# Patient Record
Sex: Female | Born: 1935 | Race: White | Hispanic: No | Marital: Married | Smoking: Former smoker
Health system: Southern US, Community
[De-identification: ages and names within clinical notes are randomized; demographics above are authoritative.]

## PROBLEM LIST (undated history)

## (undated) DIAGNOSIS — Z87898 Personal history of other specified conditions: Secondary | ICD-10-CM

## (undated) DIAGNOSIS — M75100 Unspecified rotator cuff tear or rupture of unspecified shoulder, not specified as traumatic: Secondary | ICD-10-CM

## (undated) DIAGNOSIS — Z85828 Personal history of other malignant neoplasm of skin: Secondary | ICD-10-CM

## (undated) DIAGNOSIS — S46219A Strain of muscle, fascia and tendon of other parts of biceps, unspecified arm, initial encounter: Secondary | ICD-10-CM

## (undated) DIAGNOSIS — I2510 Atherosclerotic heart disease of native coronary artery without angina pectoris: Secondary | ICD-10-CM

## (undated) DIAGNOSIS — M1990 Unspecified osteoarthritis, unspecified site: Secondary | ICD-10-CM

## (undated) DIAGNOSIS — M797 Fibromyalgia: Secondary | ICD-10-CM

## (undated) HISTORY — PX: BREAST SURGERY: SHX581

## (undated) HISTORY — PX: CHOLECYSTECTOMY: SHX55

## (undated) HISTORY — PX: CARDIAC CATHETERIZATION: SHX172

---

## 2006-03-24 HISTORY — PX: OTHER SURGICAL HISTORY: SHX169

## 2010-03-24 HISTORY — PX: JOINT REPLACEMENT: SHX530

## 2010-04-10 LAB — URINALYSIS, ROUTINE W REFLEX MICROSCOPIC
Bilirubin Urine: NEGATIVE
Hgb urine dipstick: NEGATIVE
Ketones, ur: NEGATIVE mg/dL
Nitrite: NEGATIVE
Protein, ur: NEGATIVE mg/dL
Specific Gravity, Urine: 1.02 (ref 1.005–1.030)
Urine Glucose, Fasting: NEGATIVE mg/dL
Urobilinogen, UA: 1 mg/dL (ref 0.0–1.0)
pH: 7 (ref 5.0–8.0)

## 2010-04-10 LAB — CBC
HCT: 40.4 % (ref 36.0–46.0)
Hemoglobin: 12.6 g/dL (ref 12.0–15.0)
MCH: 29.6 pg (ref 26.0–34.0)
MCHC: 31.2 g/dL (ref 30.0–36.0)
MCV: 95.1 fL (ref 78.0–100.0)
Platelets: 214 10*3/uL (ref 150–400)
RBC: 4.25 MIL/uL (ref 3.87–5.11)
RDW: 15.8 % — ABNORMAL HIGH (ref 11.5–15.5)
WBC: 7.3 10*3/uL (ref 4.0–10.5)

## 2010-04-10 LAB — DIFFERENTIAL
Basophils Absolute: 0.1 10*3/uL (ref 0.0–0.1)
Basophils Relative: 1 % (ref 0–1)
Eosinophils Absolute: 0.3 10*3/uL (ref 0.0–0.7)
Eosinophils Relative: 4 % (ref 0–5)
Lymphocytes Relative: 21 % (ref 12–46)
Lymphs Abs: 1.5 10*3/uL (ref 0.7–4.0)
Monocytes Absolute: 0.4 10*3/uL (ref 0.1–1.0)
Monocytes Relative: 6 % (ref 3–12)
Neutro Abs: 5 10*3/uL (ref 1.7–7.7)
Neutrophils Relative %: 68 % (ref 43–77)

## 2010-04-10 LAB — SURGICAL PCR SCREEN: Staphylococcus aureus: INVALID — AB

## 2010-04-10 LAB — ABO/RH: ABO/RH(D): O NEG

## 2010-04-10 LAB — TYPE AND SCREEN
ABO/RH(D): O NEG
Antibody Screen: NEGATIVE

## 2010-04-10 LAB — BASIC METABOLIC PANEL
BUN: 21 mg/dL (ref 6–23)
CO2: 30 mEq/L (ref 19–32)
Calcium: 9.7 mg/dL (ref 8.4–10.5)
Chloride: 105 mEq/L (ref 96–112)
Creatinine, Ser: 1.54 mg/dL — ABNORMAL HIGH (ref 0.4–1.2)
GFR calc Af Amer: 40 mL/min — ABNORMAL LOW (ref >60)
GFR calc non Af Amer: 33 mL/min — ABNORMAL LOW (ref >60)
Glucose, Bld: 105 mg/dL — ABNORMAL HIGH (ref 70–99)
Potassium: 4.9 mEq/L (ref 3.5–5.1)
Sodium: 142 mEq/L (ref 135–145)

## 2010-04-10 LAB — PROTIME-INR
INR: 0.97 (ref 0.00–1.49)
Prothrombin Time: 13.1 seconds (ref 11.6–15.2)

## 2010-04-12 ENCOUNTER — Inpatient Hospital Stay (HOSPITAL_COMMUNITY)
Admission: RE | Admit: 2010-04-12 | Discharge: 2010-04-16 | Payer: Self-pay | Source: Home / Self Care | Attending: Orthopaedic Surgery | Admitting: Orthopaedic Surgery

## 2010-04-15 LAB — SURGICAL PCR SCREEN
MRSA, PCR: NEGATIVE
Staphylococcus aureus: NEGATIVE

## 2010-04-16 LAB — CBC
HCT: 32.3 % — ABNORMAL LOW (ref 36.0–46.0)
HCT: 31.6 % — ABNORMAL LOW (ref 36.0–46.0)
HCT: 30.9 % — ABNORMAL LOW (ref 36.0–46.0)
Hemoglobin: 10.2 g/dL — ABNORMAL LOW (ref 12.0–15.0)
Hemoglobin: 9.8 g/dL — ABNORMAL LOW (ref 12.0–15.0)
Hemoglobin: 9.8 g/dL — ABNORMAL LOW (ref 12.0–15.0)
MCH: 29.7 pg (ref 26.0–34.0)
MCH: 29 pg (ref 26.0–34.0)
MCH: 29.6 pg (ref 26.0–34.0)
MCHC: 31.6 g/dL (ref 30.0–36.0)
MCHC: 31 g/dL (ref 30.0–36.0)
MCHC: 31.7 g/dL (ref 30.0–36.0)
MCV: 93.9 fL (ref 78.0–100.0)
MCV: 93.5 fL (ref 78.0–100.0)
MCV: 93.4 fL (ref 78.0–100.0)
Platelets: 169 10*3/uL (ref 150–400)
Platelets: 157 10*3/uL (ref 150–400)
Platelets: 173 10*3/uL (ref 150–400)
RBC: 3.44 MIL/uL — ABNORMAL LOW (ref 3.87–5.11)
RBC: 3.38 MIL/uL — ABNORMAL LOW (ref 3.87–5.11)
RBC: 3.31 MIL/uL — ABNORMAL LOW (ref 3.87–5.11)
RDW: 15.7 % — ABNORMAL HIGH (ref 11.5–15.5)
RDW: 15.7 % — ABNORMAL HIGH (ref 11.5–15.5)
RDW: 15.2 % (ref 11.5–15.5)
WBC: 9.4 10*3/uL (ref 4.0–10.5)
WBC: 8.6 10*3/uL (ref 4.0–10.5)
WBC: 10.5 10*3/uL (ref 4.0–10.5)

## 2010-04-16 LAB — BASIC METABOLIC PANEL
BUN: 13 mg/dL (ref 6–23)
BUN: 9 mg/dL (ref 6–23)
BUN: 11 mg/dL (ref 6–23)
CO2: 32 mEq/L (ref 19–32)
CO2: 32 mEq/L (ref 19–32)
CO2: 35 mEq/L — ABNORMAL HIGH (ref 19–32)
Calcium: 8.6 mg/dL (ref 8.4–10.5)
Calcium: 8.6 mg/dL (ref 8.4–10.5)
Calcium: 8.8 mg/dL (ref 8.4–10.5)
Chloride: 103 mEq/L (ref 96–112)
Chloride: 101 mEq/L (ref 96–112)
Chloride: 98 mEq/L (ref 96–112)
Creatinine, Ser: 1.11 mg/dL (ref 0.4–1.2)
Creatinine, Ser: 1.1 mg/dL (ref 0.4–1.2)
Creatinine, Ser: 1.14 mg/dL (ref 0.4–1.2)
GFR calc Af Amer: 58 mL/min — ABNORMAL LOW (ref >60)
GFR calc Af Amer: 59 mL/min — ABNORMAL LOW (ref >60)
GFR calc Af Amer: 57 mL/min — ABNORMAL LOW (ref >60)
GFR calc non Af Amer: 48 mL/min — ABNORMAL LOW (ref >60)
GFR calc non Af Amer: 49 mL/min — ABNORMAL LOW (ref >60)
GFR calc non Af Amer: 47 mL/min — ABNORMAL LOW (ref >60)
Glucose, Bld: 122 mg/dL — ABNORMAL HIGH (ref 70–99)
Glucose, Bld: 114 mg/dL — ABNORMAL HIGH (ref 70–99)
Glucose, Bld: 115 mg/dL — ABNORMAL HIGH (ref 70–99)
Potassium: 4.7 mEq/L (ref 3.5–5.1)
Potassium: 3.7 mEq/L (ref 3.5–5.1)
Potassium: 3.8 mEq/L (ref 3.5–5.1)
Sodium: 141 mEq/L (ref 135–145)
Sodium: 141 mEq/L (ref 135–145)
Sodium: 140 mEq/L (ref 135–145)

## 2010-04-16 LAB — PROTIME-INR
INR: 1.18 (ref 0.00–1.49)
INR: 1.86 — ABNORMAL HIGH (ref 0.00–1.49)
INR: 2.21 — ABNORMAL HIGH (ref 0.00–1.49)
Prothrombin Time: 15.2 seconds (ref 11.6–15.2)
Prothrombin Time: 21.6 seconds — ABNORMAL HIGH (ref 11.6–15.2)
Prothrombin Time: 24.7 seconds — ABNORMAL HIGH (ref 11.6–15.2)

## 2010-04-16 LAB — MRSA CULTURE

## 2010-04-17 LAB — PROTIME-INR
INR: 2.33 — ABNORMAL HIGH (ref 0.00–1.49)
Prothrombin Time: 25.7 seconds — ABNORMAL HIGH (ref 11.6–15.2)

## 2010-04-23 NOTE — Op Note (Signed)
Megan Wong, EIFLER                   ACCOUNT NO.:  [id number]  MEDICAL RECORD NO.:  [id number]          PATIENT TYPE:  INP  LOCATION:  0003                         FACILITY:  [facility]  PHYSICIAN:  Vanita Panda. Magnus Ivan, M.D.DATE OF BIRTH:  1936/02/10  DATE OF PROCEDURE:  04/12/2010 DATE OF DISCHARGE:                              OPERATIVE REPORT   PREOPERATIVE DIAGNOSES:  Severe osteoarthritis and degenerative joint disease, right hip.  POSTOPERATIVE DIAGNOSES:  Severe osteoarthritis and degenerative joint disease, right hip.  PROCEDURE:  Right total hip arthroplasty using anterior approach.  IMPLANTS:  DePuy Pinnacle sector acetabular component size 50, neutral polyethylene +4 liner, Corail hydroxyapatite-coated stem with standard offset size 11, size 32 +1.5 ceramic femoral head.  SURGEON:  Vanita Panda. Magnus Ivan, M.D.  ASSISTANT:  Wende Neighbors, P.A-C.  ANESTHESIA:  General.  ANTIBIOTICS:  2 g IV Ancef.  BLOOD LOSS:  500 cc.  COMPLICATIONS:  None.  INDICATIONS:  Ms. Sulton is a very pleasant 75 year old female with severe debilitating arthritis involving her right hip.  This has affected her activities of daily living greatly and she wished to proceed with a total hip arthroplasty at this standpoint.  The risks and benefits of surgery have been explained to her in detail including the risk of acute blood loss anemia, DVT, and PE.  DESCRIPTION OF PROCEDURE:  After informed consent was obtained, appropriate right hip was marked.  Ms. Herro was brought to the operating room, general anesthesia was obtained while she was on the stretcher. Foley catheter was placed and then traction boots for the Hana table were applied.  She was placed on the Hana table and perineal post placed.  Both legs were in __________ with no traction applied.  Under direct fluoroscopic guidance, I was able to assess the hip center and the centering of the pelvis.  We then prepped the left hip  with DuraPrep and then sterile drapes.  A time-out was called and she was identified as the correct patient and correct right hip.  I then used an incision centimeter distal and 3 cm posterior to the anterior superior iliac spine and carried the incision down the leg in oblique fashion.  I dissected down to the tensor fascia lata and divided the tensor fascia lata in an oblique format.  I then next proceeded with the direct anterior approach.  Retractors were placed around the lateral neck and I teased the rectus femoris off bone and placed an anterior retractor as well.  I then cauterized the lateral femoral cutaneous vessels with electrocautery and I divided the hip capsule from lateral to medial to expose the femoral head and neck.  Under direct fluoroscopic guidance, I could see where I would like to make a neck cut as well as under direct visualization I made a neck cut with an oscillating saw and then removed the head in its entirety using corkscrew guide.  I then cleaned the acetabulum off debris.  Retractors placed over the anterior lip of the acetabulum as well as posterior.  I cleaned the acetabulum off debris around the rim as well as deep in  the fovea.  I then reamed starting from a size 42 up to a 49 reamer, the last 2 reamings were performed under direct fluoroscopic guidance.  I then chose the size 50 acetabular component with screw holes.  I tapped this into place and placed a hole eliminator guide, but I did not need to place any screw holes as there was a nice secure fit.  Next, attention was turned to the femur.  A hook was placed underneath the femur within the skin muscle and the leg was externally rotated to 90 degrees, extended and adducted.  Using retractors at the medial calcar and behind the greater trochanter, I then released tissue and released the bone hook to again exposure to the femoral canal using a cookie cutter guide and rongeur.  I was able to gain  access to the femoral canal, I then broaching from the size 8 broach up to a size 11 that was not the correct version.  I trialed 32 +1.5 head and reduced this into the acetabulum.  There was a nice tight fit and I felt that I had just a little bit long in the leg length.  I wanted to trial the higher offset neck, but I felt like we already had enough offset and the hip was very stable with internal and external rotation, flexion and extension.  I then removed the trial instrumentation and placed the real Corail standard offset stem size 11 and the real 36 +1.5 ceramic head and reduced this into the acetabulum as well.  Under direct fluoroscopy, she seemed like she was just a few millimeters long on the right side, but the hip was very stable and I felt given her disease on her left hip that this was appropriately labelled as a stable hip.  I then took the hook out of the leg as well. We copiously irrigated the tissues and I closed the joint capsules with #1 Ethibond suture followed by a running interrupted #1 Vicryl in the tensor fascia lata, 2-0 Vicryl in subcutaneous tissue and running 4-0 Monocryl subcuticular stitch.  Dermabond was placed distally, but proximally placed Xeroform and two 3-0 nylon sutures because with her obesity and pannus.  I am worried about the groin crease.  We will keep this area dry.  She was awakened, taken off the Hana table, extubated, and taken to recovery room in stable condition.  All final counts were correct and there were no complications noted.  Of note, Maud Deed, PA-C was present for the entirety of the case and her assistance was crucial from beginning to end with retracting and assisting with placement of components in accurate position.     Vanita Panda. Magnus Ivan, M.D.     CYB/MEDQ  D:  04/12/2010  T:  04/13/2010  Job:  [id number]  Electronically Signed by Doneen Poisson M.D. on 04/23/2010 07:37:55 AM

## 2010-04-23 NOTE — H&P (Signed)
  NAMEBELISSA, KOOY                   ACCOUNT NO.:  [id number]  MEDICAL RECORD NO.:  [id number]          PATIENT TYPE:  INP  LOCATION:  1608                         FACILITY:  [facility]  PHYSICIAN:  Vanita Panda. Magnus Ivan, M.D.DATE OF BIRTH:  [birth year]  DATE OF ADMISSION:  04/12/2010 DATE OF DISCHARGE:                             HISTORY & PHYSICAL   CHIEF COMPLAINT:  Right hip pain with known severe osteoarthritis.  HISTORY OF PRESENT ILLNESS:  [name] is a 75 year old female with severe pain involving the right hip.  X-rays confirmed osteoarthritis of the hip.  This has been a debilitating condition for her in terms of its effects on her activities of daily living.  She would like to proceed with a hip replacement at this time.  She understands the risks and benefits of the surgery in detail including the risks of DVT, PE, and acute blood loss anemia.  PAST MEDICAL HISTORY:  She has had a stent in the heart placed in 2005. She has had her gallbladder out in 2010.  She also has a history of heart disease and rheumatoid arthritis.  MEDICATIONS: 1. Celebrex. 2. Methotrexate. 3. Gabapentin. 4. Folic acid.  ALLERGIES:  CODEINE.  SOCIAL HISTORY:  She is married, her husband is with her.  She is retired.  She does not smoke and does not drink.  FAMILY MEDICAL HISTORY:  Diabetes.  REVIEW OF SYSTEMS:  Negative for chest pain, shortness of breath, fever, chills, nausea, or vomiting.  PHYSICAL EXAMINATION:  VITAL SIGNS:  She is afebrile with stable vital signs. GENERAL:  She is a pleasant 75 year old female in no acute distress. HEENT:  Normocephalic, atraumatic.  Pupils are equal, round, and reactive to light.  Extraocular muscles intact. NECK:  Supple. LUNGS:  Clear to auscultation bilaterally. HEART:  Regular rate and rhythm. ABDOMEN:  Obese, but soft and nontender with normoactive bowel sounds. EXTREMITIES:  Right hip shows severe pain with internal and  external rotation.  X-rays confirm osteoarthritis of the right hip.  ASSESSMENT/PLAN:  This is a 75 year old female with severe osteoarthritis of her right hip.  PLAN:  We will proceed to the operating room today for a right total hip arthroplasty.     Vanita Panda. Magnus Ivan, M.D.     CYB/MEDQ  D:  04/12/2010  T:  04/12/2010  Job:  [id number]  Electronically Signed by Doneen Poisson M.D. on 04/23/2010 07:37:50 AM

## 2010-04-23 NOTE — Discharge Summary (Signed)
  [name], [name]                   ACCOUNT NO.:  [id number]  MEDICAL RECORD NO.:  [id number]          PATIENT TYPE:  INP  LOCATION:  1608                         FACILITY:  [facility]  PHYSICIAN:  Vanita Panda. Magnus Ivan, M.D.DATE OF BIRTH:  Apr 11, 1935  DATE OF ADMISSION:  04/12/2010 DATE OF DISCHARGE:  04/16/2010                              DISCHARGE SUMMARY   ADMITTING DIAGNOSES: 1. Severe osteoarthritis. 2. Degenerative joint disease, right hip.  DISCHARGE DIAGNOSES: 1. Severe osteoarthritis. 2. Degenerative joint disease, right hip.  PROCEDURE:  Right total hip arthroplasty through a direct anterior approach on April 12, 2010.  HOSPITAL COURSE:  [name] is a 75 year old female with debilitating arthritis involving her right hip, which has affected her activities of daily living, and she wished to proceed with a total hip arthroplasty, and the risks and benefits of this were explained to her in detail.  She was taken to the operating room on the day of admission where she underwent a right total hip arthroplasty through a direct anterior approach without complications.  She was then admitted to the inpatient orthopedic floor, and her hospital course was uneventful.  She had acute blood loss anemia, but the hemoglobin only went down to 9.8. She was asymptomatic from this and did not require any type of transfusion.  By the day of discharge, her incision was clean, dry and intact. She was mobilizing well with therapy, and it was felt she could be discharged safely to home with Home Health therapy and family assistance.  DISPOSITION:  To home.  DISCHARGE INSTRUCTIONS:  While she is at home, they will continue to work with her with gait training, balance and coordination using a walker as assistance.  She will not have any hip precautions otherwise. Her incision can get wet in the shower starting this Thursday, and a dry dressing applied daily.  She will continue all her  same medications as listed on her home reconciliation orders except for aspirin, and her additional medications will include Percocet, Robaxin and Coumadin. Home Health will come in to assess her INR for Coumadin dosing as well. A followup appointment will be established in two weeks in my office.     Vanita Panda. Magnus Ivan, M.D.     CYB/MEDQ  D:  04/16/2010  T:  04/16/2010  Job:  [id number]  Electronically Signed by Doneen Poisson M.D. on 04/23/2010 07:37:45 AM

## 2010-12-24 ENCOUNTER — Other Ambulatory Visit: Payer: Self-pay | Admitting: Orthopaedic Surgery

## 2010-12-24 DIAGNOSIS — M545 Low back pain, unspecified: Secondary | ICD-10-CM

## 2010-12-31 ENCOUNTER — Ambulatory Visit
Admission: RE | Admit: 2010-12-31 | Discharge: 2010-12-31 | Disposition: A | Discharge status: Home / Self Care | Payer: Medicare Other | Source: Ambulatory Visit | Attending: Orthopaedic Surgery | Admitting: Orthopaedic Surgery

## 2010-12-31 DIAGNOSIS — M545 Low back pain, unspecified: Secondary | ICD-10-CM

## 2011-11-03 ENCOUNTER — Other Ambulatory Visit: Payer: Self-pay | Admitting: Orthopaedic Surgery

## 2011-11-03 DIAGNOSIS — M25551 Pain in right hip: Secondary | ICD-10-CM

## 2011-11-05 ENCOUNTER — Ambulatory Visit
Admission: RE | Admit: 2011-11-05 | Discharge: 2011-11-05 | Disposition: A | Discharge status: Home / Self Care | Payer: Medicare Other | Source: Ambulatory Visit | Attending: Orthopaedic Surgery | Admitting: Orthopaedic Surgery

## 2011-11-05 DIAGNOSIS — M25551 Pain in right hip: Secondary | ICD-10-CM

## 2011-12-17 IMAGING — CR DG CHEST 2V
2 series · 2 of 2 positions shown · non-contrast
Comparison: None.

CLINICAL DATA: Right hip osteoarthritis, preop

CHEST - 2 VIEW

[w chest pa]
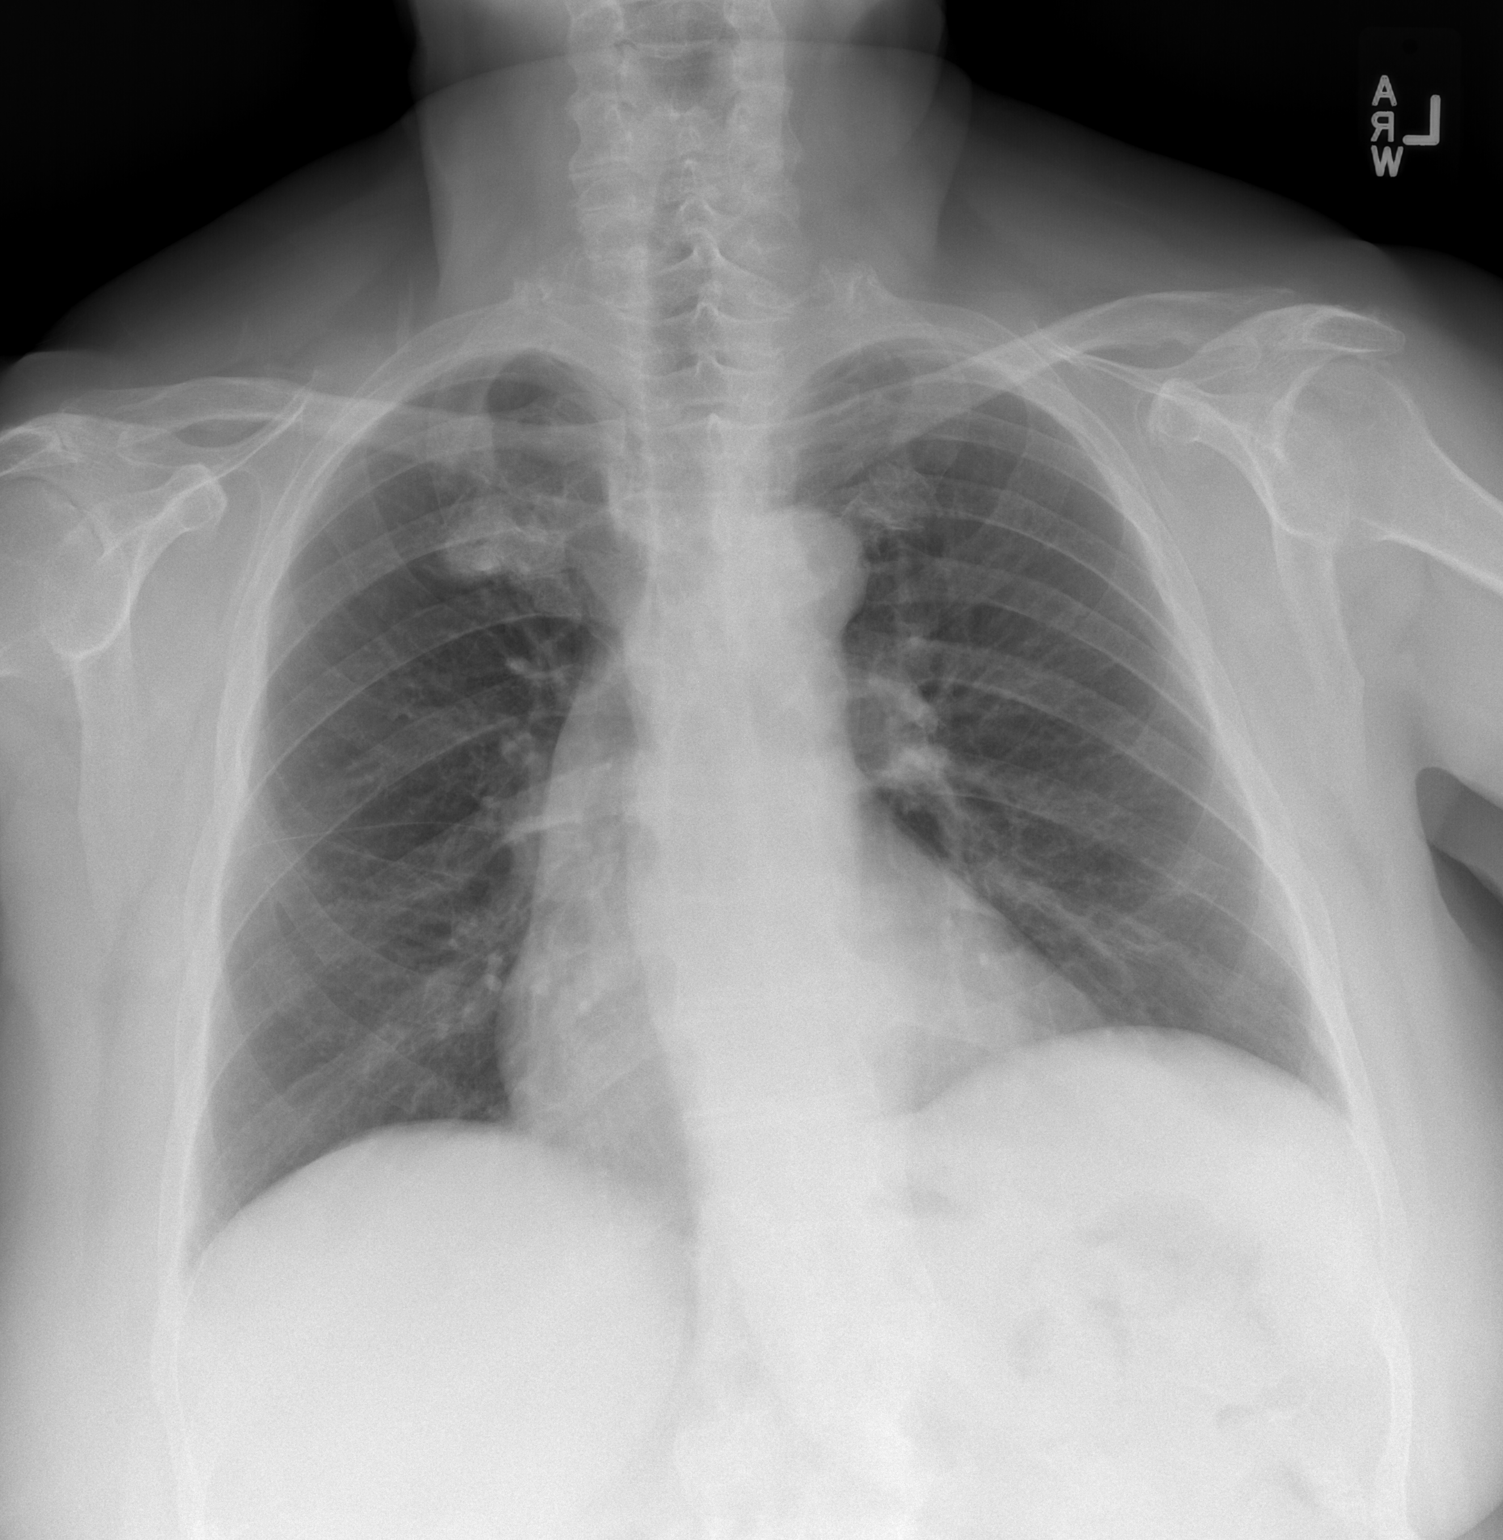

[w chest lat]
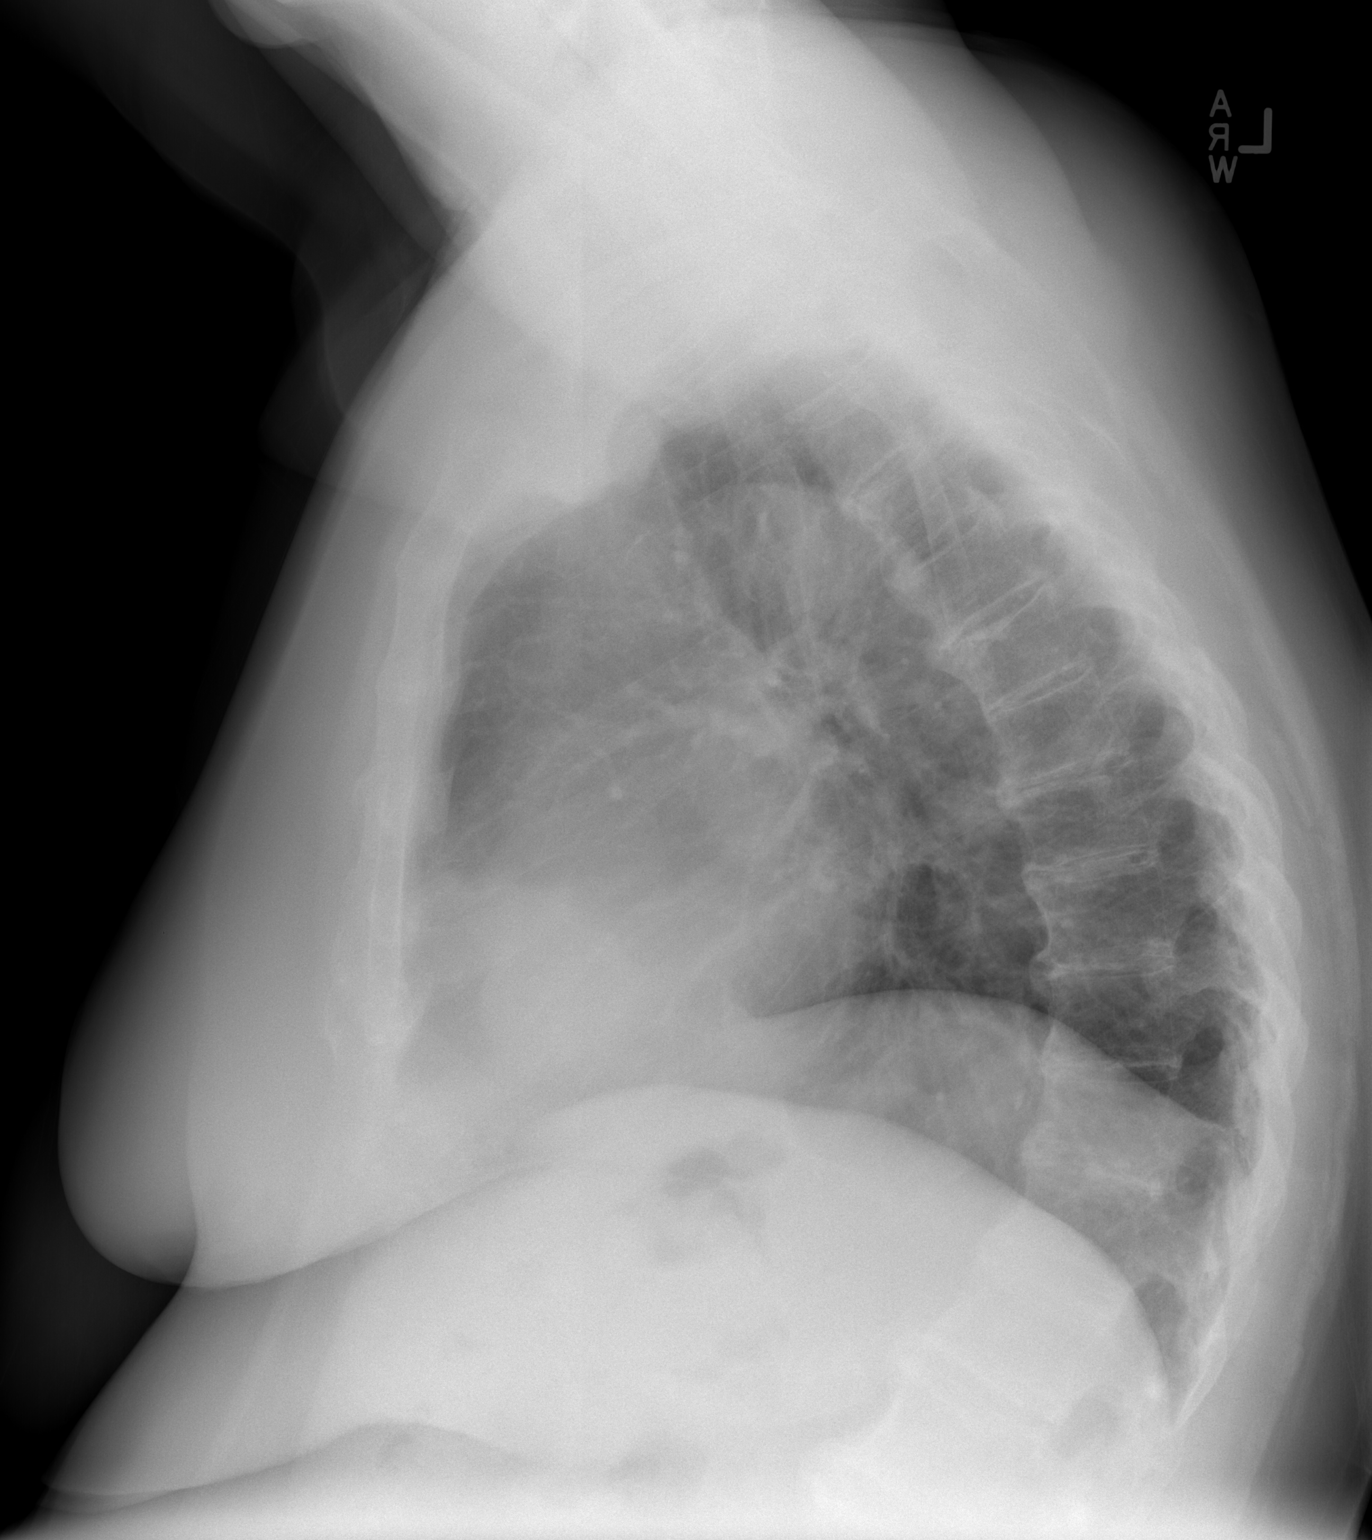

[2 of 2 positions shown; findings below may reference images not displayed]

FINDINGS: Asymmetric prominence of the right first rib
costochondral calcifications versus right upper lobe nodule.  Left
lung clear.  Heart size normal.  No effusion.  Mild spondylitic
changes at multiple contiguous levels through the mid and lower
thoracic spine.
IMPRESSION: 1.  Right upper lung nodular density.  Favor asymmetric
costochondral calcification over mass.  In the absence of any prior
studies to confirm stability, this could be confirmed with a
lordotic view or noncontrast CT.

## 2011-12-21 IMAGING — CR DG HIP 1V PORT*R*
1 series · 1 of 1 positions shown · non-contrast
Comparison: Portable AP pelvis x-ray obtained concurrently.

CLINICAL DATA: Postop right hip arthroplasty.

PORTABLE RIGHT HIP - 1 VIEW [DATE]/3503 0035 hours:

[shoot- thru lateral]
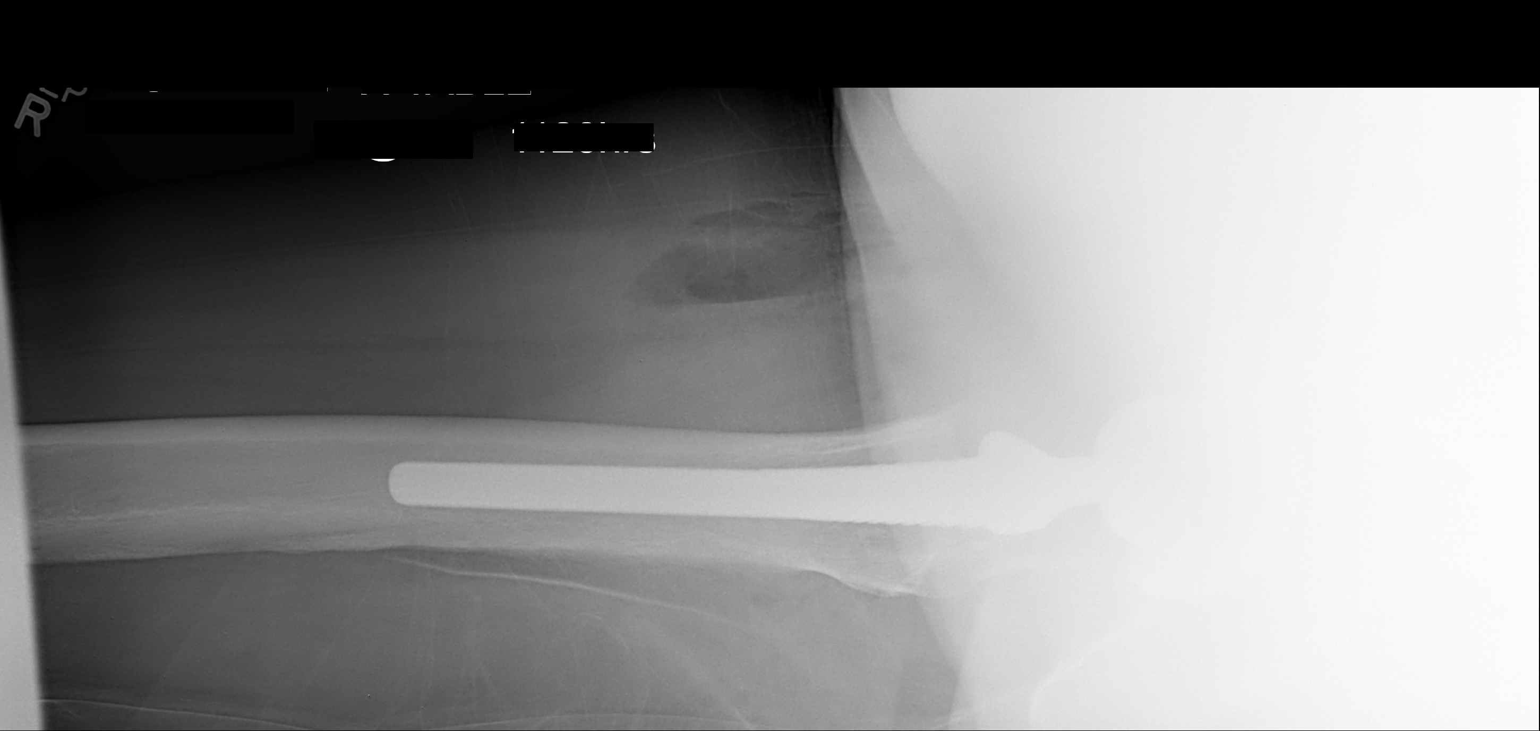

[1 of 1 positions shown; findings below may reference images not displayed]

FINDINGS: Anatomic alignment post right hip arthroplasty.  No
complicating features.  Gas within the adjacent soft tissues, as
expected.
IMPRESSION: Anatomic alignment post right hip arthroplasty without acute
complicating features.

## 2011-12-23 IMAGING — CR DG HIP 1V PORT*R*
1 series · 1 of 1 positions shown · non-contrast
Comparison: Radiographs dated 04/12/2010

CLINICAL DATA: Right hip pain.  Right total hip prosthesis
insertion 2 days ago.

PORTABLE RIGHT HIP - 1 VIEW

[AP]
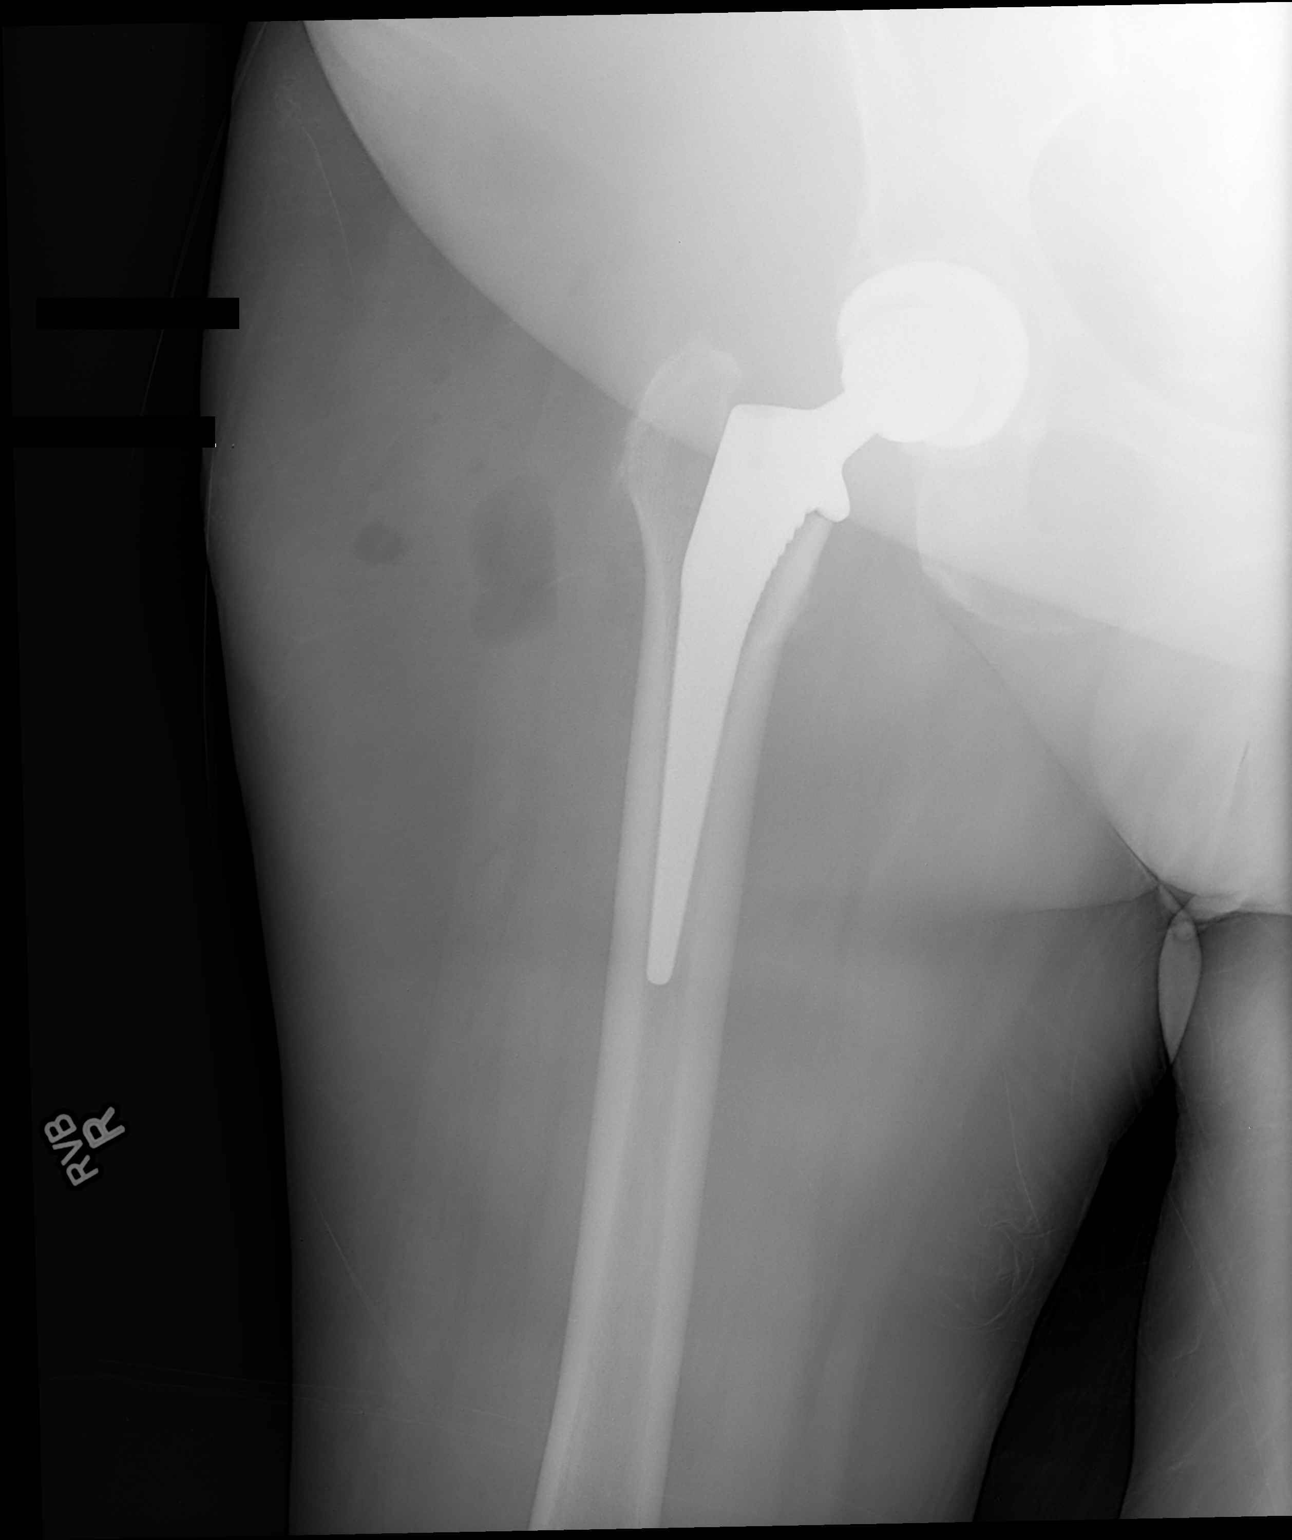

[1 of 1 positions shown; findings below may reference images not displayed]

FINDINGS: The acetabular and femoral components appear in excellent
position.  No fracture or dislocation.
IMPRESSION: No acute abnormalities.

## 2013-06-02 ENCOUNTER — Other Ambulatory Visit: Payer: Self-pay | Admitting: Orthopaedic Surgery

## 2013-06-02 DIAGNOSIS — M25511 Pain in right shoulder: Secondary | ICD-10-CM

## 2013-06-05 ENCOUNTER — Ambulatory Visit
Admission: RE | Admit: 2013-06-05 | Discharge: 2013-06-05 | Disposition: A | Discharge status: Home / Self Care | Payer: Medicare Other | Source: Ambulatory Visit | Attending: Orthopaedic Surgery | Admitting: Orthopaedic Surgery

## 2013-06-05 DIAGNOSIS — M25511 Pain in right shoulder: Secondary | ICD-10-CM

## 2013-06-10 ENCOUNTER — Other Ambulatory Visit (HOSPITAL_COMMUNITY): Payer: Self-pay | Admitting: Orthopaedic Surgery

## 2013-06-23 ENCOUNTER — Encounter (HOSPITAL_COMMUNITY): Payer: Self-pay | Admitting: Pharmacy Technician

## 2013-07-04 ENCOUNTER — Encounter (HOSPITAL_COMMUNITY): Payer: Self-pay

## 2013-07-04 ENCOUNTER — Encounter (HOSPITAL_COMMUNITY)
Admission: RE | Admit: 2013-07-04 | Discharge: 2013-07-04 | Disposition: A | Discharge status: Home / Self Care | Payer: Medicare Other | Source: Ambulatory Visit | Attending: Orthopaedic Surgery | Admitting: Orthopaedic Surgery

## 2013-07-04 DIAGNOSIS — I2510 Atherosclerotic heart disease of native coronary artery without angina pectoris: Secondary | ICD-10-CM | POA: Insufficient documentation

## 2013-07-04 DIAGNOSIS — S43429A Sprain of unspecified rotator cuff capsule, initial encounter: Secondary | ICD-10-CM | POA: Insufficient documentation

## 2013-07-04 DIAGNOSIS — X58XXXA Exposure to other specified factors, initial encounter: Secondary | ICD-10-CM | POA: Insufficient documentation

## 2013-07-04 DIAGNOSIS — Z01810 Encounter for preprocedural cardiovascular examination: Secondary | ICD-10-CM | POA: Insufficient documentation

## 2013-07-04 DIAGNOSIS — Z01812 Encounter for preprocedural laboratory examination: Secondary | ICD-10-CM | POA: Insufficient documentation

## 2013-07-04 DIAGNOSIS — M19019 Primary osteoarthritis, unspecified shoulder: Secondary | ICD-10-CM | POA: Insufficient documentation

## 2013-07-04 HISTORY — DX: Personal history of other malignant neoplasm of skin: Z85.828

## 2013-07-04 HISTORY — DX: Unspecified rotator cuff tear or rupture of unspecified shoulder, not specified as traumatic: M75.100

## 2013-07-04 HISTORY — DX: Personal history of other specified conditions: Z87.898

## 2013-07-04 HISTORY — DX: Atherosclerotic heart disease of native coronary artery without angina pectoris: I25.10

## 2013-07-04 HISTORY — DX: Fibromyalgia: M79.7

## 2013-07-04 HISTORY — DX: Unspecified osteoarthritis, unspecified site: M19.90

## 2013-07-04 HISTORY — DX: Strain of muscle, fascia and tendon of other parts of biceps, unspecified arm, initial encounter: S46.219A

## 2013-07-04 LAB — CBC
HEMATOCRIT: 41.5 % (ref 36.0–46.0)
Hemoglobin: 13.1 g/dL (ref 12.0–15.0)
MCH: 28.9 pg (ref 26.0–34.0)
MCHC: 31.6 g/dL (ref 30.0–36.0)
MCV: 91.4 fL (ref 78.0–100.0)
Platelets: 199 10*3/uL (ref 150–400)
RBC: 4.54 MIL/uL (ref 3.87–5.11)
RDW: 15.9 % — AB (ref 11.5–15.5)
WBC: 6.2 10*3/uL (ref 4.0–10.5)

## 2013-07-04 LAB — BASIC METABOLIC PANEL
BUN: 22 mg/dL (ref 6–23)
CALCIUM: 9.4 mg/dL (ref 8.4–10.5)
CO2: 29 mEq/L (ref 19–32)
Chloride: 103 mEq/L (ref 96–112)
Creatinine, Ser: 1.27 mg/dL — ABNORMAL HIGH (ref 0.50–1.10)
GFR calc Af Amer: 47 mL/min — ABNORMAL LOW (ref >90)
GFR calc non Af Amer: 40 mL/min — ABNORMAL LOW (ref >90)
Glucose, Bld: 90 mg/dL (ref 70–99)
Potassium: 4.1 mEq/L (ref 3.7–5.3)
SODIUM: 142 meq/L (ref 137–147)

## 2013-07-04 NOTE — Progress Notes (Signed)
Contacted Dr. Maureen Ralphs Blackman concerning meds to stop preop I was instructed pt may continue all meds and my take methotrexate day of surgery also Pt informed

## 2013-07-04 NOTE — Patient Instructions (Addendum)
Megan Wong  07/04/2013                           YOUR PROCEDURE IS SCHEDULED ON: 07/08/13               PLEASE REPORT TO SHORT STAY CENTER AT : 12:00 PM               CALL THIS NUMBER IF ANY PROBLEMS THE DAY OF SURGERY :               [phone]                                REMEMBER:   Do not eat food  AFTER MIDNIGHT   May have clear liquids UNTIL 6 HOURS BEFORE SURGERY 8:30 AM               Take these medicines the morning of surgery with A SIP OF WATER:  METOPROLOL / METHOTREXATE   Do not wear jewelry, make-up   Do not wear lotions, powders, or perfumes.   Do not shave legs or underarms 12 hrs. before surgery (men may shave face)  Do not bring valuables to the hospital.  Contacts, dentures or bridgework may not be worn into surgery.  Leave suitcase in the car. After surgery it may be brought to your room.  For patients admitted to the hospital more than one night, checkout time is            11:00 AM                                                       The day of discharge.   Patients discharged the day of surgery will not be allowed to drive home.            If going home same day of surgery, must have someone stay with you              FIRST 24 hrs at home and arrange for some one to drive you              home from hospital.                                                            [facility] Before surgery, you can play an important role.  Because skin is not sterile, your skin needs to be as free of germs as possible.  You can reduce the number of germs on your skin by washing with CHG (chlorahexidine gluconate) soap before surgery.  CHG is an antiseptic cleaner which kills germs and bonds with the skin to continue killing germs even after washing. Please DO NOT use if you have an allergy to CHG or antibacterial soaps.  If your skin becomes reddened/irritated stop using the CHG and inform your nurse when you arrive at Short  Stay. Do not shave (including legs and underarms) for at least 48 hours prior to the first CHG  shower.  You may shave your face. Please follow these instructions carefully:  1.  Shower with CHG Soap the night before surgery and the  morning of Surgery.  2.  If you choose to wash your hair, wash your hair first as usual with your  normal  shampoo.  3.  After you shampoo, rinse your hair and body thoroughly to remove the  shampoo.                           4.  Use CHG as you would any other liquid soap.  You can apply chg directly  to the skin and wash                       Gently with a scrungie or clean washcloth.  5.  Apply the CHG Soap to your body ONLY FROM THE NECK DOWN.   Do not use on open                           Wound or open sores. Avoid contact with eyes, ears mouth and genitals (private parts).                        Genitals (private parts) with your normal soap.             6.  Wash thoroughly, paying special attention to the area where your surgery  will be performed.  7.  Thoroughly rinse your body with warm water from the neck down.  8.  DO NOT shower/wash with your normal soap after using and rinsing off  the CHG Soap.                9.  Pat yourself dry with a clean towel.            10.  Wear clean pajamas.            11.  Place clean sheets on your bed the night of your first shower and do not  sleep with pets. Day of Surgery : Do not apply any lotions/deodorants the morning of surgery.  Please wear clean clothes to the hospital/surgery center.  FAILURE TO FOLLOW THESE INSTRUCTIONS MAY RESULT IN THE CANCELLATION OF YOUR SURGERY PATIENT SIGNATURE_________________________________  NURSE SIGNATURE__________________________________    CLEAR LIQUID DIET   Foods Allowed                                                                     Foods Excluded  Coffee and tea, regular and decaf                             liquids that you cannot  Plain Jell-O in any flavor                                              see through such as: Fruit ices (not with fruit pulp)  milk, soups, orange juice  Iced Popsicles                                    All solid food Carbonated beverages, regular and diet                                    Cranberry, grape and apple juices Sports drinks like Gatorade Lightly seasoned clear broth or consume(fat free) Sugar, honey syrup  Sample Menu Breakfast                                Lunch                                     Supper Cranberry juice                    Beef broth                            Chicken broth Jell-O                                     Grape juice                           Apple juice Coffee or tea                        Jell-O                                      Popsicle                                                Coffee or tea                        Coffee or tea

## 2013-07-08 ENCOUNTER — Ambulatory Visit (HOSPITAL_COMMUNITY)
Admission: RE | Admit: 2013-07-08 | Discharge: 2013-07-08 | Disposition: A | Discharge status: Home / Self Care | Payer: Medicare Other | Source: Ambulatory Visit | Attending: Orthopaedic Surgery | Admitting: Orthopaedic Surgery

## 2013-07-08 ENCOUNTER — Encounter (HOSPITAL_COMMUNITY): Payer: Medicare Other | Admitting: Anesthesiology

## 2013-07-08 ENCOUNTER — Ambulatory Visit (HOSPITAL_COMMUNITY): Payer: Medicare Other | Admitting: Anesthesiology

## 2013-07-08 ENCOUNTER — Encounter (HOSPITAL_COMMUNITY)
Admission: RE | Disposition: A | Discharge status: Home / Self Care | Payer: Self-pay | Source: Ambulatory Visit | Attending: Orthopaedic Surgery

## 2013-07-08 ENCOUNTER — Encounter (HOSPITAL_COMMUNITY): Payer: Self-pay | Admitting: *Deleted

## 2013-07-08 DIAGNOSIS — Z85828 Personal history of other malignant neoplasm of skin: Secondary | ICD-10-CM | POA: Insufficient documentation

## 2013-07-08 DIAGNOSIS — IMO0001 Reserved for inherently not codable concepts without codable children: Secondary | ICD-10-CM | POA: Insufficient documentation

## 2013-07-08 DIAGNOSIS — R002 Palpitations: Secondary | ICD-10-CM | POA: Insufficient documentation

## 2013-07-08 DIAGNOSIS — M67919 Unspecified disorder of synovium and tendon, unspecified shoulder: Secondary | ICD-10-CM | POA: Insufficient documentation

## 2013-07-08 DIAGNOSIS — M7580 Other shoulder lesions, unspecified shoulder: Secondary | ICD-10-CM

## 2013-07-08 DIAGNOSIS — M19019 Primary osteoarthritis, unspecified shoulder: Secondary | ICD-10-CM | POA: Insufficient documentation

## 2013-07-08 DIAGNOSIS — M25819 Other specified joint disorders, unspecified shoulder: Secondary | ICD-10-CM | POA: Insufficient documentation

## 2013-07-08 DIAGNOSIS — I10 Essential (primary) hypertension: Secondary | ICD-10-CM | POA: Insufficient documentation

## 2013-07-08 DIAGNOSIS — M719 Bursopathy, unspecified: Principal | ICD-10-CM | POA: Insufficient documentation

## 2013-07-08 DIAGNOSIS — Z9089 Acquired absence of other organs: Secondary | ICD-10-CM | POA: Insufficient documentation

## 2013-07-08 DIAGNOSIS — I2510 Atherosclerotic heart disease of native coronary artery without angina pectoris: Secondary | ICD-10-CM | POA: Insufficient documentation

## 2013-07-08 DIAGNOSIS — Z96649 Presence of unspecified artificial hip joint: Secondary | ICD-10-CM | POA: Insufficient documentation

## 2013-07-08 DIAGNOSIS — M75110 Incomplete rotator cuff tear or rupture of unspecified shoulder, not specified as traumatic: Secondary | ICD-10-CM

## 2013-07-08 DIAGNOSIS — Z87891 Personal history of nicotine dependence: Secondary | ICD-10-CM | POA: Insufficient documentation

## 2013-07-08 HISTORY — PX: SHOULDER ARTHROSCOPY WITH ROTATOR CUFF REPAIR: SHX5685

## 2013-07-08 SURGERY — ARTHROSCOPY, SHOULDER, WITH ROTATOR CUFF REPAIR
Anesthesia: General | Site: Shoulder | Laterality: Right

## 2013-07-08 MED ORDER — PROMETHAZINE HCL 25 MG/ML IJ SOLN
6.2500 mg | INTRAMUSCULAR | Status: DC | PRN
Start: 2013-07-08 — End: 2013-07-08

## 2013-07-08 MED ORDER — ROPIVACAINE HCL 5 MG/ML IJ SOLN
INTRAMUSCULAR | Status: DC | PRN
  Administered 2013-07-08: 30 mL via PERINEURAL

## 2013-07-08 MED ORDER — HYDROMORPHONE HCL PF 1 MG/ML IJ SOLN
0.2500 mg | INTRAMUSCULAR | Status: DC | PRN
Start: 2013-07-08 — End: 2013-07-08

## 2013-07-08 MED ORDER — FENTANYL CITRATE 0.05 MG/ML IJ SOLN
INTRAMUSCULAR | Status: AC
  Filled 2013-07-08: qty 2

## 2013-07-08 MED ORDER — FENTANYL CITRATE 0.05 MG/ML IJ SOLN
INTRAMUSCULAR | Status: DC | PRN
  Administered 2013-07-08 (×2): 50 ug via INTRAVENOUS

## 2013-07-08 MED ORDER — SUCCINYLCHOLINE CHLORIDE 20 MG/ML IJ SOLN
INTRAMUSCULAR | Status: DC | PRN
  Administered 2013-07-08: 100 mg via INTRAVENOUS

## 2013-07-08 MED ORDER — LACTATED RINGERS IV SOLN
INTRAVENOUS | Status: DC
  Administered 2013-07-08: 1000 mL via INTRAVENOUS

## 2013-07-08 MED ORDER — TRAMADOL HCL 50 MG PO TABS: 50.0000 mg | ORAL_TABLET | Freq: Four times a day (QID) | ORAL | Status: AC | PRN

## 2013-07-08 MED ORDER — ONDANSETRON HCL 4 MG/2ML IJ SOLN
INTRAMUSCULAR | Status: AC
  Filled 2013-07-08: qty 2

## 2013-07-08 MED ORDER — ONDANSETRON HCL 4 MG PO TABS: 4.0000 mg | ORAL_TABLET | Freq: Three times a day (TID) | ORAL | Status: DC | PRN

## 2013-07-08 MED ORDER — MIDAZOLAM HCL 2 MG/2ML IJ SOLN
INTRAMUSCULAR | Status: AC
  Filled 2013-07-08: qty 2

## 2013-07-08 MED ORDER — GLYCOPYRROLATE 0.2 MG/ML IJ SOLN
INTRAMUSCULAR | Status: AC
  Filled 2013-07-08: qty 2

## 2013-07-08 MED ORDER — CEFAZOLIN SODIUM-DEXTROSE 2-3 GM-% IV SOLR
2.0000 g | INTRAVENOUS | Status: AC
  Administered 2013-07-08: 2 g via INTRAVENOUS

## 2013-07-08 MED ORDER — CYCLOBENZAPRINE HCL 10 MG PO TABS: 10.0000 mg | ORAL_TABLET | Freq: Three times a day (TID) | ORAL | Status: AC | PRN

## 2013-07-08 MED ORDER — FENTANYL CITRATE 0.05 MG/ML IJ SOLN
50.0000 ug | INTRAMUSCULAR | Status: DC | PRN
  Administered 2013-07-08: 25 ug via INTRAVENOUS

## 2013-07-08 MED ORDER — OXYCODONE-ACETAMINOPHEN 5-325 MG PO TABS: 1.0000 | ORAL_TABLET | ORAL | Status: DC | PRN

## 2013-07-08 MED ORDER — MIDAZOLAM HCL 2 MG/2ML IJ SOLN
1.0000 mg | INTRAMUSCULAR | Status: DC | PRN
  Administered 2013-07-08: 1 mg via INTRAVENOUS

## 2013-07-08 MED ORDER — EPINEPHRINE HCL 1 MG/ML IJ SOLN
INTRAMUSCULAR | Status: DC | PRN
  Administered 2013-07-08: 2 mL

## 2013-07-08 MED ORDER — EPHEDRINE SULFATE 50 MG/ML IJ SOLN
INTRAMUSCULAR | Status: AC
  Filled 2013-07-08: qty 1

## 2013-07-08 MED ORDER — SODIUM CHLORIDE 0.9 % IR SOLN
Status: DC | PRN
  Administered 2013-07-08: 6000 mL

## 2013-07-08 MED ORDER — GLYCOPYRROLATE 0.2 MG/ML IJ SOLN
INTRAMUSCULAR | Status: DC | PRN
  Administered 2013-07-08: 0.1 mg via INTRAVENOUS
  Administered 2013-07-08: 0.2 mg via INTRAVENOUS

## 2013-07-08 MED ORDER — EPINEPHRINE HCL 1 MG/ML IJ SOLN
INTRAMUSCULAR | Status: AC
  Filled 2013-07-08: qty 2

## 2013-07-08 MED ORDER — GLYCOPYRROLATE 0.2 MG/ML IJ SOLN
INTRAMUSCULAR | Status: AC
  Filled 2013-07-08: qty 1

## 2013-07-08 MED ORDER — ONDANSETRON HCL 4 MG/2ML IJ SOLN
INTRAMUSCULAR | Status: DC | PRN
  Administered 2013-07-08: 4 mg via INTRAVENOUS

## 2013-07-08 MED ORDER — ONDANSETRON 4 MG PO TBDP: 4.0000 mg | ORAL_TABLET | Freq: Three times a day (TID) | ORAL | Status: AC | PRN

## 2013-07-08 MED ORDER — EPHEDRINE SULFATE 50 MG/ML IJ SOLN
INTRAMUSCULAR | Status: DC | PRN
  Administered 2013-07-08: 10 mg via INTRAVENOUS

## 2013-07-08 MED ORDER — SODIUM CHLORIDE 0.9 % IJ SOLN
INTRAMUSCULAR | Status: AC
  Filled 2013-07-08: qty 10

## 2013-07-08 MED ORDER — ROPIVACAINE HCL 5 MG/ML IJ SOLN
INTRAMUSCULAR | Status: AC
  Filled 2013-07-08: qty 30

## 2013-07-08 MED ORDER — PROPOFOL 10 MG/ML IV BOLUS
INTRAVENOUS | Status: DC | PRN
  Administered 2013-07-08: 100 mg via INTRAVENOUS

## 2013-07-08 MED ORDER — LACTATED RINGERS IV SOLN
INTRAVENOUS | Status: DC
  Administered 2013-07-08: 16:00:00 via INTRAVENOUS

## 2013-07-08 MED ORDER — PROPOFOL 10 MG/ML IV BOLUS
INTRAVENOUS | Status: AC
  Filled 2013-07-08: qty 20

## 2013-07-08 MED ORDER — DEXTROSE 5 % IV SOLN
10.0000 mg | INTRAVENOUS | Status: DC | PRN
  Administered 2013-07-08: 50 ug/min via INTRAVENOUS

## 2013-07-08 SURGICAL SUPPLY — 29 items
BLADE CUTTER GATOR 3.5 (BLADE) ×2 IMPLANT
BLADE SURG SZ11 CARB STEEL (BLADE) ×2 IMPLANT
BUR OVAL 4.0 (BURR) ×2 IMPLANT
DRAPE SHOULDER BEACH CHAIR (DRAPES) ×2 IMPLANT
DRAPE U-SHAPE 47X51 STRL (DRAPES) ×2 IMPLANT
DURAPREP 26ML APPLICATOR (WOUND CARE) ×2 IMPLANT
GAUZE XEROFORM 1X8 LF (GAUZE/BANDAGES/DRESSINGS) ×2 IMPLANT
GLOVE BIOGEL PI IND STRL 8 (GLOVE) ×2 IMPLANT
GLOVE BIOGEL PI IND STRL 8.5 (GLOVE) ×1 IMPLANT
GLOVE BIOGEL PI INDICATOR 8 (GLOVE) ×2
GLOVE BIOGEL PI INDICATOR 8.5 (GLOVE) ×1
GLOVE SURG SS PI 7.5 STRL IVOR (GLOVE) ×2 IMPLANT
GLOVE SURG SS PI 8.0 STRL IVOR (GLOVE) ×4 IMPLANT
GOWN STRL REUS W/TWL XL LVL3 (GOWN DISPOSABLE) ×6 IMPLANT
KIT BASIN OR (CUSTOM PROCEDURE TRAY) ×2 IMPLANT
MANIFOLD NEPTUNE II (INSTRUMENTS) ×2 IMPLANT
NEEDLE SPNL 18GX3.5 QUINCKE PK (NEEDLE) ×2 IMPLANT
PACK SHOULDER CUSTOM OPM052 (CUSTOM PROCEDURE TRAY) ×2 IMPLANT
PAD ABD 8X10 STRL (GAUZE/BANDAGES/DRESSINGS) ×2 IMPLANT
POSITIONER SURGICAL ARM (MISCELLANEOUS) ×2 IMPLANT
SET ARTHROSCOPY TUBING (MISCELLANEOUS) ×1
SET ARTHROSCOPY TUBING LN (MISCELLANEOUS) ×1 IMPLANT
SLING ARM IMMOBILIZER MED (SOFTGOODS) ×2 IMPLANT
SPONGE GAUZE 4X4 12PLY (GAUZE/BANDAGES/DRESSINGS) ×2 IMPLANT
SUT ETHILON 4 0 PS 2 18 (SUTURE) IMPLANT
TAPE CLOTH SURG 4X10 WHT LF (GAUZE/BANDAGES/DRESSINGS) ×2 IMPLANT
TOWEL OR 17X26 10 PK STRL BLUE (TOWEL DISPOSABLE) ×2 IMPLANT
TUBING CONNECTING 10 (TUBING) ×2 IMPLANT
WAND 90 DEG TURBOVAC W/CORD (SURGICAL WAND) ×2 IMPLANT

## 2013-07-08 NOTE — Transfer of Care (Signed)
Immediate Anesthesia Transfer of Care Note  Patient: Megan Wong  Procedure(s) Performed: Procedure(s): RIGHT SHOULDER ARTHROSCOPY WITH DEBRIDEMENT, Subacromial Decompression (Right)  Patient Location: PACU  Anesthesia Type:General and Regional  Level of Consciousness: Patient easily awoken, sedated, comfortable, cooperative, following commands, responds to stimulation.   Airway & Oxygen Therapy: Patient spontaneously breathing, ventilating well, oxygen via simple oxygen mask.  Post-op Assessment: Report given to PACU RN, vital signs reviewed and stable, moving all extremities.   Post vital signs: Reviewed and stable.  Complications: No apparent anesthesia complications

## 2013-07-08 NOTE — Anesthesia Procedure Notes (Signed)
Anesthesia Regional Block:  Interscalene brachial plexus block  Pre-Anesthetic Checklist: ,, timeout performed, Correct Patient, Correct Site, Correct Laterality, Correct Procedure, Correct Position, site marked, Risks and benefits discussed,  Surgical consent,  Pre-op evaluation,  At surgeon's request and post-op pain management   Prep: chloraprep       Needles:  Injection technique: Single-shot  Needle Type: Stimiplex          Additional Needles:  Procedures: ultrasound guided (picture in chart) Interscalene brachial plexus block  Nerve Stimulator or Paresthesia:  Response: No,   Additional Responses:   Narrative:  Start time: 07/08/2013 12:37 PM End time: 07/08/2013 12:40 PM Injection made incrementally with aspirations every 5 mL.  Performed by: Personally  Anesthesiologist: Lucille PassyAlexander Ercie Eliasen MD  Additional Notes: Risks, benefits and alternative to block explained extensively.  Patient tolerated procedure well, without complications.

## 2013-07-08 NOTE — Brief Op Note (Signed)
07/08/2013  3:12 PM  PATIENT:  Megan Wong  78 y.o. female  PRE-OPERATIVE DIAGNOSIS:  Right shoulder rotator cuff tear, acromioclavicular joint arthritis  POST-OPERATIVE DIAGNOSIS:  Right shoulder rotator cuff tear, acromioclavicular joint arthritis  PROCEDURE:  Procedure(s): RIGHT SHOULDER ARTHROSCOPY WITH DEBRIDEMENT, Subacromial Decompression (Right)  SURGEON:  Surgeon(s) and Role:    * Kathryne Hitchhristopher Y Pryor Guettler, MD - Primary  PHYSICIAN ASSISTANT: Rexene EdisonGil Clark, PA-C  ANESTHESIA:   regional and general  EBL:   minimal  BLOOD ADMINISTERED:none  DRAINS: none   LOCAL MEDICATIONS USED:  NONE  SPECIMEN:  No Specimen  DISPOSITION OF SPECIMEN:  N/A  COUNTS:  YES  TOURNIQUET:  * No tourniquets in log *  DICTATION: .Other Dictation: Dictation Number [id number]  PLAN OF CARE: Discharge to home after PACU  PATIENT DISPOSITION:  PACU - hemodynamically stable.   Delay start of Pharmacological VTE agent (>24hrs) due to surgical blood loss or risk of bleeding: not applicable

## 2013-07-08 NOTE — H&P (Signed)
Megan Wong is an 78 y.o. female.   Chief Complaint:   Right shoulder impingement syndrome with near full-thickness rotator cuff tear HPI:   78 yo female with worsening right shoulder pain and weakness.  Has failed conservative treatment with therapy, steroid injections, pain meds, NSAIDs, rest, ice/heat, and time.  A MRI of her right shoulder shows severe rotator cuff disease.  Given the failure of conservative treatment, she wishes to proceed with a shoulder arthroscopy.  She understands the risks of surgery and gives informed consent.  Past Medical History  Diagnosis Date  . Coronary artery disease     STENT 7 YRS AGO - followed by Dr. Leeann Mustenaldo in [place]   . History of palpitations     takes Metoprolol - pt denies any Hx HBP  . Arthritis   . RCT (rotator cuff tear)     RT SHOULDER  . Rupture of biceps tendon     RT  . Fibromyalgia   . History of nonmelanoma skin cancer     Past Surgical History  Procedure Laterality Date  . Breast surgery  "MANY YRS AGO"    CHRONIC MASTITIS SURG  . Cholecystectomy    . Cardiac catheterization    . Stented coronary artery  2008  . Joint replacement  2012    RT HIP    No family history on file. Social History:  reports that she quit smoking about 54 years ago. She does not have any smokeless tobacco history on file. She reports that she does not drink alcohol or use illicit drugs.  Allergies:  Allergies  Allergen Reactions  . Codeine     lethargic  . Latex Rash    Takes her skin off    No prescriptions prior to admission    No results found for this or any previous visit (from the past 48 hour(s)). No results found.  Review of Systems  All other systems reviewed and are negative.   There were no vitals taken for this visit. Physical Exam  Constitutional: She is oriented to person, place, and time. She appears well-developed and well-nourished.  HENT:  Head: Normocephalic and atraumatic.  Eyes: EOM are normal. Pupils are  equal, round, and reactive to light.  Neck: Normal range of motion. Neck supple.  Cardiovascular: Normal rate and regular rhythm.   Respiratory: Effort normal and breath sounds normal.  GI: Soft. Bowel sounds are normal.  Musculoskeletal:       Right shoulder: She exhibits decreased range of motion, tenderness and decreased strength.  Neurological: She is alert and oriented to person, place, and time.  Skin: Skin is warm and dry.  Psychiatric: She has a normal mood and affect.     Assessment/Plan Right shoulder with impingement and near full-thickness rotator cuff tear.  1)  To the OR today for a right shoulder arthroscopy and repair of the rotator cuff if possible vs debridement with a subacromial decompression.  Kathryne HitchChristopher Y Blackman 07/08/2013, 8:12 AM

## 2013-07-08 NOTE — Discharge Instructions (Signed)
Ice your shoulder on and off over the next few days due to swelling. You may come out of your sling as comfort allows to work on shoulder motion. You can remove your dressings in 24 hours and start getting your actual incisions wet in the shower daily. Apply band-aids to your incisions daily.

## 2013-07-08 NOTE — Anesthesia Preprocedure Evaluation (Addendum)
Anesthesia Evaluation  Patient identified by MRN, date of birth, ID band Patient awake    Reviewed: Allergy & Precautions, H&P , NPO status , Patient's Chart, lab work & pertinent test results  Airway Mallampati: II TM Distance: >3 FB Neck ROM: Full    Dental  (+) Teeth Intact, Dental Advisory Given   Pulmonary neg pulmonary ROS, former smoker,    Pulmonary exam normal       Cardiovascular hypertension, Pt. on home beta blockers + CAD and + Cardiac Stents Rhythm:Regular Rate:Normal     Neuro/Psych  Neuromuscular disease negative psych ROS   GI/Hepatic negative GI ROS, Neg liver ROS,   Endo/Other  negative endocrine ROS  Renal/GU negative Renal ROS  negative genitourinary   Musculoskeletal negative musculoskeletal ROS (+) Fibromyalgia -  Abdominal   Peds  Hematology negative hematology ROS (+)   Anesthesia Other Findings   Reproductive/Obstetrics                          Anesthesia Physical Anesthesia Plan  ASA: III  Anesthesia Plan: General   Post-op Pain Management:    Induction: Intravenous  Airway Management Planned: Oral ETT  Additional Equipment:   Intra-op Plan:   Post-operative Plan: Extubation in OR  Informed Consent: I have reviewed the patients History and Physical, chart, labs and discussed the procedure including the risks, benefits and alternatives for the proposed anesthesia with the patient or authorized representative who has indicated his/her understanding and acceptance.   Dental advisory given  Plan Discussed with: CRNA  Anesthesia Plan Comments:         Anesthesia Quick Evaluation

## 2013-07-08 NOTE — Anesthesia Postprocedure Evaluation (Signed)
Anesthesia Post Note  Patient: Megan Wong  Procedure(s) Performed: Procedure(s) (LRB): RIGHT SHOULDER ARTHROSCOPY WITH DEBRIDEMENT, Subacromial Decompression (Right)  Anesthesia type: General  Patient location: PACU  Post pain: Pain level controlled  Post assessment: Post-op Vital signs reviewed  Last Vitals:  Filed Vitals:   07/08/13 1550  BP:   Pulse: 88  Temp:   Resp: 19    Post vital signs: Reviewed  Level of consciousness: sedated  Complications: No apparent anesthesia complications

## 2013-07-08 NOTE — Progress Notes (Signed)
D/c instructions reviewed with pt and pt's husband.  Pt able to ambulate to restroom and void.  Vss, afebrile.  No pain at rt shoulder surgical site.  Encouraged wiggling fingers frequently to encourage circulation.  Pt voiced understanding.  D/c home with husband via wheelchair to car.

## 2013-07-09 NOTE — Op Note (Signed)
NAMCy Blamer:  Megan Wong, Megan Wong                   ACCOUNT NO.:  [id number]  MEDICAL RECORD NO.:  [id number]  LOCATION:  [facility]                         [facility]  PHYSICIAN:  Vanita PandaChristopher Y. Magnus IvanBlackman, M.D.DATE OF BIRTH:  08-05-1935  DATE OF PROCEDURE:  07/08/2013 DATE OF DISCHARGE:  07/08/2013                              OPERATIVE REPORT   PREOPERATIVE DIAGNOSES:  Right shoulder severe impingement syndrome and partial-thickness rotator cuff tear.  POSTOPERATIVE DIAGNOSIS:  Right shoulder severe impingement syndrome and partial-thickness rotator cuff tear.  PROCEDURE:  Right shoulder arthroscopy with extensive debridement and subacromial decompression.  SURGEON:  Vanita PandaChristopher Y. Magnus IvanBlackman, M.D.  ASSISTANT:  Richardean CanalGilbert Clark, PA-C  ANESTHESIA: 1. Regional right shoulder block. 2. General.  BLOOD LOSS:  Minimal.  COMPLICATIONS:  None.  INDICATIONS:  Megan Wong is a very pleasant 78 year old female, well known to me.  I have been seeing her for her right shoulder for some time now. She had signs and symptoms of impingement syndrome, at least partial thickness rotator cuff tear.  We tried several steroid injections in her subacromial space of her shoulder.  She has been to therapy.  Taken anti- inflammatories, rest, ice, heat, and time.  We eventually obtained an MRI of her shoulder and showed she had a ruptured biceps tendon. Significant narrowing of her subacromial outlet almost a full thickness rotator cuff tear and significant inflammation and bursitis throughout the shoulder.  Given the failure of conservative treatment.  Her daily pain and she wished to proceed with arthroscopic intervention.  PROCEDURE DESCRIPTION:  After informed consent was obtained, appropriate right shoulder was marked.  Anesthesia obtained regional shoulder block. She was then brought to the operating room, placed supine on the operating table.  General anesthesia was then obtained.  We then fashion her  beach-chair position with appropriate positioning of the head and neck and padding of the down on operative left arm.  There is bending at the waist and knees, and her right shoulder prepped draped with DuraPrep sterile drapes and placed in in-line skeletal traction device using a 10 pounds of traction with 45 degrees of forward flexion and neutral rotation.  Time-out was called to identify correct patient, correct right shoulder.  I then made a posterior lateral arthroscopy portal and entered the glenohumeral joint.  Right away, could see that she had torn her biceps and completely of the subscapularis tendon was intact.  There was significant inflammation of the anterior shoulder and the undersurface of the rotator cuff.  I made a anterior portal in the rotator interval and placed a soft tissue ablation wand and carried out extensive debridement in the glenohumeral joint of inflamed synovium and tissue as well as degenerative anterior and superior labrum.  I also debrided the undersurface of the rotator cuff.  I then entered the subacromial space with the posterior portal and made a separate lateral portal.  We found a very tight subacromial space with a type 3 acromion. Using a soft tissue ablation wand, I performed a bursectomy and entire subacromial space.  Then, I cleaned the tendinosis of the rotator cuff. I used a high-speed bur and performed a partial acromioplasty.  Gave her a  good space in that shoulder.  We then removed all instrumentation and closed with 3 portal sites with interrupted nylon suture.  Xeroform and well-padded sterile dressing was applied.  Her shoulder was placed in a standard sling.  She was awakened, extubated, and taken to recovery in stable condition.  All final counts were correct.  There were no complications noted.  Of note, Richardean CanalGilbert Clark, PA-C assisted in entire case and his assistance was crucial specially with traction on the  shoulder.     Vanita Pandahristopher Y. Magnus IvanBlackman, M.D.     CYB/MEDQ  D:  07/08/2013  T:  07/09/2013  Job:  [id number]

## 2013-07-11 ENCOUNTER — Encounter (HOSPITAL_COMMUNITY): Payer: Self-pay | Admitting: Orthopaedic Surgery

## 2013-08-05 ENCOUNTER — Ambulatory Visit (INDEPENDENT_AMBULATORY_CARE_PROVIDER_SITE_OTHER): Payer: Medicare Other | Admitting: Physical Therapy

## 2013-08-05 DIAGNOSIS — M25619 Stiffness of unspecified shoulder, not elsewhere classified: Secondary | ICD-10-CM

## 2013-08-05 DIAGNOSIS — M25519 Pain in unspecified shoulder: Secondary | ICD-10-CM

## 2013-08-05 DIAGNOSIS — R5381 Other malaise: Secondary | ICD-10-CM

## 2013-08-08 ENCOUNTER — Encounter (INDEPENDENT_AMBULATORY_CARE_PROVIDER_SITE_OTHER): Payer: Medicare Other | Admitting: Physical Therapy

## 2013-08-08 DIAGNOSIS — M25519 Pain in unspecified shoulder: Secondary | ICD-10-CM

## 2013-08-08 DIAGNOSIS — M25619 Stiffness of unspecified shoulder, not elsewhere classified: Secondary | ICD-10-CM

## 2013-08-08 DIAGNOSIS — R5381 Other malaise: Secondary | ICD-10-CM

## 2013-08-08 DIAGNOSIS — M75110 Incomplete rotator cuff tear or rupture of unspecified shoulder, not specified as traumatic: Secondary | ICD-10-CM

## 2013-08-12 ENCOUNTER — Encounter (INDEPENDENT_AMBULATORY_CARE_PROVIDER_SITE_OTHER): Payer: Medicare Other | Admitting: Physical Therapy

## 2013-08-12 DIAGNOSIS — M25619 Stiffness of unspecified shoulder, not elsewhere classified: Secondary | ICD-10-CM

## 2013-08-12 DIAGNOSIS — M25519 Pain in unspecified shoulder: Secondary | ICD-10-CM

## 2013-08-12 DIAGNOSIS — M75110 Incomplete rotator cuff tear or rupture of unspecified shoulder, not specified as traumatic: Secondary | ICD-10-CM

## 2013-08-18 ENCOUNTER — Encounter (INDEPENDENT_AMBULATORY_CARE_PROVIDER_SITE_OTHER): Payer: Medicare Other | Admitting: Physical Therapy

## 2013-08-18 DIAGNOSIS — R5381 Other malaise: Secondary | ICD-10-CM

## 2013-08-18 DIAGNOSIS — M25619 Stiffness of unspecified shoulder, not elsewhere classified: Secondary | ICD-10-CM

## 2013-08-18 DIAGNOSIS — M25519 Pain in unspecified shoulder: Secondary | ICD-10-CM

## 2013-08-18 DIAGNOSIS — M75110 Incomplete rotator cuff tear or rupture of unspecified shoulder, not specified as traumatic: Secondary | ICD-10-CM

## 2013-08-23 ENCOUNTER — Encounter (INDEPENDENT_AMBULATORY_CARE_PROVIDER_SITE_OTHER): Payer: Medicare Other | Admitting: Physical Therapy

## 2013-08-23 DIAGNOSIS — M75110 Incomplete rotator cuff tear or rupture of unspecified shoulder, not specified as traumatic: Secondary | ICD-10-CM

## 2013-08-23 DIAGNOSIS — M25619 Stiffness of unspecified shoulder, not elsewhere classified: Secondary | ICD-10-CM

## 2013-08-23 DIAGNOSIS — R5381 Other malaise: Secondary | ICD-10-CM

## 2013-08-23 DIAGNOSIS — M25519 Pain in unspecified shoulder: Secondary | ICD-10-CM

## 2013-08-29 ENCOUNTER — Encounter (INDEPENDENT_AMBULATORY_CARE_PROVIDER_SITE_OTHER): Payer: Medicare Other | Admitting: Physical Therapy

## 2013-08-29 DIAGNOSIS — R5381 Other malaise: Secondary | ICD-10-CM

## 2013-08-29 DIAGNOSIS — M25619 Stiffness of unspecified shoulder, not elsewhere classified: Secondary | ICD-10-CM

## 2013-08-29 DIAGNOSIS — M75110 Incomplete rotator cuff tear or rupture of unspecified shoulder, not specified as traumatic: Secondary | ICD-10-CM

## 2013-08-29 DIAGNOSIS — M25519 Pain in unspecified shoulder: Secondary | ICD-10-CM

## 2013-09-01 ENCOUNTER — Encounter (INDEPENDENT_AMBULATORY_CARE_PROVIDER_SITE_OTHER): Payer: Medicare Other

## 2013-09-01 DIAGNOSIS — M25619 Stiffness of unspecified shoulder, not elsewhere classified: Secondary | ICD-10-CM

## 2013-09-01 DIAGNOSIS — M75110 Incomplete rotator cuff tear or rupture of unspecified shoulder, not specified as traumatic: Secondary | ICD-10-CM

## 2013-09-01 DIAGNOSIS — M25519 Pain in unspecified shoulder: Secondary | ICD-10-CM

## 2013-09-01 DIAGNOSIS — R5381 Other malaise: Secondary | ICD-10-CM

## 2013-09-05 ENCOUNTER — Encounter (INDEPENDENT_AMBULATORY_CARE_PROVIDER_SITE_OTHER): Payer: Medicare Other | Admitting: Physical Therapy

## 2013-09-05 DIAGNOSIS — R5381 Other malaise: Secondary | ICD-10-CM

## 2013-09-05 DIAGNOSIS — M25619 Stiffness of unspecified shoulder, not elsewhere classified: Secondary | ICD-10-CM

## 2013-09-05 DIAGNOSIS — M25519 Pain in unspecified shoulder: Secondary | ICD-10-CM

## 2013-09-05 DIAGNOSIS — M75110 Incomplete rotator cuff tear or rupture of unspecified shoulder, not specified as traumatic: Secondary | ICD-10-CM

## 2013-09-08 ENCOUNTER — Encounter: Payer: Medicare Other | Admitting: Physical Therapy

## 2013-09-12 ENCOUNTER — Encounter (INDEPENDENT_AMBULATORY_CARE_PROVIDER_SITE_OTHER): Payer: Medicare Other | Admitting: Physical Therapy

## 2013-09-12 DIAGNOSIS — R5381 Other malaise: Secondary | ICD-10-CM

## 2013-09-12 DIAGNOSIS — M25619 Stiffness of unspecified shoulder, not elsewhere classified: Secondary | ICD-10-CM

## 2013-09-12 DIAGNOSIS — M75110 Incomplete rotator cuff tear or rupture of unspecified shoulder, not specified as traumatic: Secondary | ICD-10-CM

## 2013-09-12 DIAGNOSIS — M25519 Pain in unspecified shoulder: Secondary | ICD-10-CM

## 2013-09-16 ENCOUNTER — Encounter (INDEPENDENT_AMBULATORY_CARE_PROVIDER_SITE_OTHER): Payer: Medicare Other | Admitting: Physical Therapy

## 2013-09-16 DIAGNOSIS — M25519 Pain in unspecified shoulder: Secondary | ICD-10-CM

## 2013-09-16 DIAGNOSIS — M25619 Stiffness of unspecified shoulder, not elsewhere classified: Secondary | ICD-10-CM

## 2013-09-16 DIAGNOSIS — R5381 Other malaise: Secondary | ICD-10-CM

## 2013-09-16 DIAGNOSIS — M75110 Incomplete rotator cuff tear or rupture of unspecified shoulder, not specified as traumatic: Secondary | ICD-10-CM

## 2013-09-20 ENCOUNTER — Encounter (INDEPENDENT_AMBULATORY_CARE_PROVIDER_SITE_OTHER): Payer: Medicare Other | Admitting: Physical Therapy

## 2013-09-20 DIAGNOSIS — M75110 Incomplete rotator cuff tear or rupture of unspecified shoulder, not specified as traumatic: Secondary | ICD-10-CM

## 2013-09-20 DIAGNOSIS — M25519 Pain in unspecified shoulder: Secondary | ICD-10-CM

## 2013-09-20 DIAGNOSIS — M25619 Stiffness of unspecified shoulder, not elsewhere classified: Secondary | ICD-10-CM

## 2013-09-20 DIAGNOSIS — R5381 Other malaise: Secondary | ICD-10-CM

## 2013-09-28 ENCOUNTER — Encounter (INDEPENDENT_AMBULATORY_CARE_PROVIDER_SITE_OTHER): Payer: Medicare Other | Admitting: Physical Therapy

## 2013-09-28 DIAGNOSIS — R5381 Other malaise: Secondary | ICD-10-CM

## 2013-09-28 DIAGNOSIS — M25519 Pain in unspecified shoulder: Secondary | ICD-10-CM

## 2013-09-28 DIAGNOSIS — M25619 Stiffness of unspecified shoulder, not elsewhere classified: Secondary | ICD-10-CM

## 2013-09-28 DIAGNOSIS — M75110 Incomplete rotator cuff tear or rupture of unspecified shoulder, not specified as traumatic: Secondary | ICD-10-CM

## 2013-10-03 ENCOUNTER — Encounter (INDEPENDENT_AMBULATORY_CARE_PROVIDER_SITE_OTHER): Payer: Medicare Other | Admitting: Physical Therapy

## 2013-10-03 DIAGNOSIS — M25619 Stiffness of unspecified shoulder, not elsewhere classified: Secondary | ICD-10-CM

## 2013-10-03 DIAGNOSIS — M25519 Pain in unspecified shoulder: Secondary | ICD-10-CM

## 2013-10-03 DIAGNOSIS — M75110 Incomplete rotator cuff tear or rupture of unspecified shoulder, not specified as traumatic: Secondary | ICD-10-CM

## 2013-10-03 DIAGNOSIS — R5381 Other malaise: Secondary | ICD-10-CM

## 2013-10-10 ENCOUNTER — Encounter (INDEPENDENT_AMBULATORY_CARE_PROVIDER_SITE_OTHER): Payer: Medicare Other | Admitting: Physical Therapy

## 2013-10-10 DIAGNOSIS — M25519 Pain in unspecified shoulder: Secondary | ICD-10-CM

## 2013-10-10 DIAGNOSIS — M25619 Stiffness of unspecified shoulder, not elsewhere classified: Secondary | ICD-10-CM

## 2013-10-10 DIAGNOSIS — R5381 Other malaise: Secondary | ICD-10-CM | POA: Diagnosis not present

## 2013-10-10 DIAGNOSIS — M75110 Incomplete rotator cuff tear or rupture of unspecified shoulder, not specified as traumatic: Secondary | ICD-10-CM | POA: Diagnosis not present

## 2015-02-07 ENCOUNTER — Other Ambulatory Visit: Payer: Self-pay | Admitting: Physician Assistant

## 2015-02-07 DIAGNOSIS — M545 Low back pain: Secondary | ICD-10-CM

## 2015-02-20 ENCOUNTER — Ambulatory Visit
Admission: RE | Admit: 2015-02-20 | Discharge: 2015-02-20 | Disposition: A | Discharge status: Home / Self Care | Payer: Medicare Other | Source: Ambulatory Visit | Attending: Physician Assistant | Admitting: Physician Assistant

## 2015-02-20 DIAGNOSIS — M545 Low back pain: Secondary | ICD-10-CM

## 2015-02-23 ENCOUNTER — Other Ambulatory Visit: Payer: Self-pay | Admitting: Orthopaedic Surgery

## 2015-02-23 DIAGNOSIS — M545 Low back pain: Principal | ICD-10-CM

## 2015-02-23 DIAGNOSIS — G8929 Other chronic pain: Secondary | ICD-10-CM

## 2015-03-12 ENCOUNTER — Other Ambulatory Visit: Payer: Self-pay | Admitting: Orthopaedic Surgery

## 2015-03-12 ENCOUNTER — Ambulatory Visit
Admission: RE | Admit: 2015-03-12 | Discharge: 2015-03-12 | Disposition: A | Discharge status: Home / Self Care | Payer: Medicare Other | Source: Ambulatory Visit | Attending: Orthopaedic Surgery | Admitting: Orthopaedic Surgery

## 2015-03-12 DIAGNOSIS — M545 Low back pain, unspecified: Secondary | ICD-10-CM

## 2015-03-12 DIAGNOSIS — G8929 Other chronic pain: Secondary | ICD-10-CM

## 2015-03-12 MED ORDER — METHYLPREDNISOLONE ACETATE 40 MG/ML INJ SUSP (RADIOLOG
120.0000 mg | Freq: Once | INTRAMUSCULAR | Status: AC
  Administered 2015-03-12: 120 mg via EPIDURAL

## 2015-03-12 MED ORDER — IOHEXOL 180 MG/ML  SOLN
1.0000 mL | Freq: Once | INTRAMUSCULAR | Status: AC | PRN
  Administered 2015-03-12: 1 mL via EPIDURAL

## 2015-03-12 NOTE — Discharge Instructions (Signed)

## 2016-10-01 ENCOUNTER — Ambulatory Visit (INDEPENDENT_AMBULATORY_CARE_PROVIDER_SITE_OTHER): Payer: Medicare Other | Admitting: Physical Medicine and Rehabilitation

## 2016-10-01 ENCOUNTER — Encounter (INDEPENDENT_AMBULATORY_CARE_PROVIDER_SITE_OTHER): Payer: Self-pay | Admitting: Physical Medicine and Rehabilitation

## 2016-10-01 ENCOUNTER — Ambulatory Visit (INDEPENDENT_AMBULATORY_CARE_PROVIDER_SITE_OTHER): Payer: Medicare Other

## 2016-10-01 VITALS — BP 128/67 | HR 65

## 2016-10-01 DIAGNOSIS — M47816 Spondylosis without myelopathy or radiculopathy, lumbar region: Secondary | ICD-10-CM

## 2016-10-01 DIAGNOSIS — M5416 Radiculopathy, lumbar region: Secondary | ICD-10-CM | POA: Diagnosis not present

## 2016-10-01 DIAGNOSIS — M5116 Intervertebral disc disorders with radiculopathy, lumbar region: Secondary | ICD-10-CM | POA: Diagnosis not present

## 2016-10-01 DIAGNOSIS — M5441 Lumbago with sciatica, right side: Secondary | ICD-10-CM

## 2016-10-01 DIAGNOSIS — G8929 Other chronic pain: Secondary | ICD-10-CM

## 2016-10-01 NOTE — Progress Notes (Deleted)
Right sided lower back and right leg pain. Pain radiates down back of thigh and side/ front of calf. No numbness or tingling. Bending over, sitting, standing make pain worse. Pain is not as bad at night. Last injection over a year ago for pain on left helped a lot.

## 2016-10-03 ENCOUNTER — Encounter (INDEPENDENT_AMBULATORY_CARE_PROVIDER_SITE_OTHER): Payer: Self-pay | Admitting: Physical Medicine and Rehabilitation

## 2016-10-03 NOTE — Progress Notes (Deleted)
Megan Wong - 81 y.o. female MRN [id number]  Date of birth: [birth year]  Office Visit Note: Visit Date: 10/01/2016 PCP: Dalbert Mayotte, MD Referred by: Dalbert Mayotte, MD  Subjective: Chief Complaint  Patient presents with  . Lower Back - Pain   HPI: HPI  ROS Otherwise per HPI.  Assessment & Plan: Visit Diagnoses:  1. Lumbar radiculopathy   2. Spondylosis without myelopathy or radiculopathy, lumbar region     Plan: No additional findings.   Meds & Orders: No orders of the defined types were placed in this encounter.   Orders Placed This Encounter  Procedures  . XR Lumbar Spine 2-3 Views    Follow-up: No Follow-up on file.   Procedures: No procedures performed  No notes on file   Clinical History: MRI LUMBAR SPINE WITHOUT CONTRAST  TECHNIQUE: Multiplanar, multisequence MR imaging of the lumbar spine was performed. No intravenous contrast was administered.  COMPARISON:  12/31/2010  FINDINGS: No marrow signal abnormality suggestive of fracture, infection, or neoplasm. Normal conus signal and morphology. No perispinal abnormality to explain back pain. Bilateral renal sinus cysts are noted.  Degenerative changes:  T12- L1: Spondylosis without herniation or impingement.  L1-L2: Ventral spondylotic spurring. No herniation or impingement. Negative facets.  L2-L3: Bulky ventral spondylotic spurring. No herniation. No impingement. Negative facets.  L3-L4: Spondylotic endplate spurring and mild annulus bulging. No disc herniation. Facet arthropathy, mild, with ligamentum flavum thickening.  L4-L5: Mild annulus bulging. Degenerative ligamentous overgrowth and facet spurring. No herniation or impingement.  L5-S1:Annulus bulging and spondylotic spurring. Most advanced level of facet arthropathy with advanced joint narrowing and bilateral facet spurring. Small left foraminal protrusion, chronic and without neural impingement.  IMPRESSION: Lower  lumbar facet arthropathy with milder disc degeneration, described above and stable from 2012. No compressive stenosis.   Electronically Signed   By: Marnee Spring M.D.   On: 02/20/2015 19:26  She reports that she quit smoking about 57 years ago. She has never used smokeless tobacco. No results for input(s): HGBA1C, LABURIC in the last 8760 hours.  Objective:  VS:  HT:    WT:   BMI:     BP:128/67  HR:65bpm  TEMP: ( )  RESP:  Physical Exam  Ortho Exam Imaging: No results found.  Past Medical/Family/Surgical/Social History: Medications & Allergies reviewed per EMR Patient Active Problem List   Diagnosis Date Noted  . Incomplete rotator cuff tear right shoulder 07/08/2013   Past Medical History:  Diagnosis Date  . Arthritis   . Coronary artery disease    STENT 7 YRS AGO - followed by Dr. Leeann Must in [place]   . Fibromyalgia   . History of nonmelanoma skin cancer   . History of palpitations    takes Metoprolol - pt denies any Hx HBP  . RCT (rotator cuff tear)    RT SHOULDER  . Rupture of biceps tendon    RT   History reviewed. No pertinent family history. Past Surgical History:  Procedure Laterality Date  . BREAST SURGERY  "MANY YRS AGO"   CHRONIC MASTITIS SURG  . CARDIAC CATHETERIZATION    . CHOLECYSTECTOMY    . JOINT REPLACEMENT  2012   RT HIP  . SHOULDER ARTHROSCOPY WITH ROTATOR CUFF REPAIR Right 07/08/2013   Procedure: RIGHT SHOULDER ARTHROSCOPY WITH DEBRIDEMENT, Subacromial Decompression;  Surgeon: Kathryne Hitch, MD;  Location: [facility];  Service: Orthopedics;  Laterality: Right;  . STENTED CORONARY ARTERY  2008   Social History   Occupational History  .  Not on file.   Social History Main Topics  . Smoking status: Former Smoker    Quit date: 07/05/1959  . Smokeless tobacco: Never Used  . Alcohol use No  . Drug use: No  . Sexual activity: Not on file

## 2016-10-03 NOTE — Progress Notes (Signed)
Megan Wong - 81 y.o. female MRN [id number]  Date of birth: 1935-06-20  Office Visit Note: Visit Date: 10/01/2016 PCP: Dalbert Mayotte, MD Referred by: Dalbert Mayotte, MD  Subjective: Chief Complaint  Patient presents with  . Lower Back - Pain   HPI: Megan Wong is a 81 year old female normally followed by Dr. Magnus Ivan in our office. We actually saw her in 2016 and completed a left L5 transforaminal epidural steroid injection with good relief of symptoms at that time. She did actually had an injection prior to that a few weeks with [facility] imaging in the same location. During that time she also had an MRI of the lumbar spine and this is reviewed below. She comes in today with worsening right sided lower back pain and right leg pain and a pretty classic L5 distribution once again. The pain radiates down the back of the thigh laterally and posteriorly and then into the more front anterior lateral part of the calf. She uses heat and Tylenol without much relief. She does have rheumatoid arthritis and reports that she was recently taken off of Celebrex and feels like that may have exacerbated the symptoms. She denies any left-sided complaints at this point which was what she was having over a year ago. She's had no new trauma. She's had no focal weakness or bowel or bladder difficulties. She's had no fevers chills or night sweats. She's had no recent opioid pain medications. She does not take any medication such as gabapentin or other nerve pain type medications. She denies any hip or groin pain other than the radiating symptoms and leg. She reports that bending over and going from sit to stand as well as standing makes the symptoms worse. She reports pain is actually less at night when she is resting. She's had no recent x-rays. No recent physical therapy. The pain has been ongoing now for several months.    Review of Systems  Constitutional: Negative for chills, fever, malaise/fatigue and weight  loss.  HENT: Negative for hearing loss and sinus pain.   Eyes: Negative for blurred vision, double vision and photophobia.  Respiratory: Negative for cough and shortness of breath.   Cardiovascular: Negative for chest pain, palpitations and leg swelling.  Gastrointestinal: Negative for abdominal pain, nausea and vomiting.  Genitourinary: Negative for flank pain.  Musculoskeletal: Positive for back pain. Negative for myalgias.  Skin: Negative for itching and rash.  Neurological: Negative for tremors, focal weakness and weakness.  Endo/Heme/Allergies: Negative.   Psychiatric/Behavioral: Negative for depression.  All other systems reviewed and are negative.  Otherwise per HPI.  Assessment & Plan: Visit Diagnoses:  1. Lumbar radiculopathy   2. Spondylosis without myelopathy or radiculopathy, lumbar region   3. Radiculopathy due to lumbar intervertebral disc disorder   4. Chronic bilateral low back pain with right-sided sciatica     Plan: Findings:  Chronic worsening several month history of right-sided radicular-type pain although there is no paresthesias. She does get pain past the knee. She does have prior MRI consistent with facet arthropathy at L5-S1 with foraminal narrowing bilaterally. She had a small left foraminal protrusion at that time that was helped by transforaminal epidural injection. At this point her symptoms could be related just to the facet arthropathy with a facet joint type syndrome with referral pattern more an L5 distribution. She could have a small new disc protrusion to she does get some pain with sitting. She has no red flag symptoms to warrant new MRI. X-ray images today  do show facet arthropathy at L5-S1 without any listhesis or other issues. I do think she would benefit from diagnostic right L5 transforaminal epidural steroid injection. Depending on the relief with that we could look at regrouping with physical therapy or medication change. We discussed staying active  and different exercises she can do. We'll see her back for the injection. I spent more than 20 minutes speaking face-to-face with the patient with 50% of the time in counseling.    Meds & Orders: No orders of the defined types were placed in this encounter.   Orders Placed This Encounter  Procedures  . XR Lumbar Spine 2-3 Views    Follow-up: Return for Right L5 transforaminal epidural steroid injection.   Procedures: No procedures performed  No notes on file   Clinical History: MRI LUMBAR SPINE WITHOUT CONTRAST 02/20/2015  TECHNIQUE: Multiplanar, multisequence MR imaging of the lumbar spine was performed. No intravenous contrast was administered.  COMPARISON: 12/31/2010  FINDINGS: No marrow signal abnormality suggestive of fracture, infection, or neoplasm. Normal conus signal and morphology. No perispinal abnormality to explain back pain. Bilateral renal sinus cysts are noted.  Degenerative changes:  T12- L1: Spondylosis without herniation or impingement.  L1-L2: Ventral spondylotic spurring. No herniation or impingement. Negative facets.  L2-L3: Bulky ventral spondylotic spurring. No herniation. No impingement. Negative facets.  L3-L4: Spondylotic endplate spurring and mild annulus bulging. No disc herniation. Facet arthropathy, mild, with ligamentum flavum thickening.  L4-L5: Mild annulus bulging. Degenerative ligamentous overgrowth and facet spurring. No herniation or impingement.  L5-S1:Annulus bulging and spondylotic spurring. Most advanced level of facet arthropathy with advanced joint narrowing and bilateral facet spurring. Small left foraminal protrusion, chronic and without neural impingement.  IMPRESSION: Lower lumbar facet arthropathy with milder disc degeneration, described above and stable from 2012. No compressive stenosis.  She reports that she quit smoking about 57 years ago. She has never used smokeless tobacco. No results for  input(s): HGBA1C, LABURIC in the last 8760 hours.  Objective:  VS:  HT:    WT:   BMI:     BP:128/67  HR:65bpm  TEMP: ( )  RESP:  Physical Exam  Constitutional: She is oriented to person, place, and time. She appears well-developed and well-nourished.  Eyes: Pupils are equal, round, and reactive to light. Conjunctivae and EOM are normal.  Cardiovascular: Normal rate and intact distal pulses.   Pulmonary/Chest: Effort normal.  Musculoskeletal:  Patient ambulates without aid. She has no pain with hip rotation. She has good distal strength without clonus. Mild tenderness over the right greater trochanter. His have pain with extension of the lumbar spine and pain across the lower lumbosacral junction.  Neurological: She is alert and oriented to person, place, and time. She exhibits normal muscle tone.  Skin: Skin is warm and dry. No rash noted. No erythema.  Psychiatric: She has a normal mood and affect. Her behavior is normal.  Nursing note and vitals reviewed.   Ortho Exam Imaging: No results found.  Past Medical/Family/Surgical/Social History: Medications & Allergies reviewed per EMR Patient Active Problem List   Diagnosis Date Noted  . Incomplete rotator cuff tear right shoulder 07/08/2013   Past Medical History:  Diagnosis Date  . Arthritis   . Coronary artery disease    STENT 7 YRS AGO - followed by Dr. Leeann Mustenaldo in [place]   . Fibromyalgia   . History of nonmelanoma skin cancer   . History of palpitations    takes Metoprolol - pt denies any Hx HBP  .  RCT (rotator cuff tear)    RT SHOULDER  . Rupture of biceps tendon    RT   History reviewed. No pertinent family history. Past Surgical History:  Procedure Laterality Date  . BREAST SURGERY  "MANY YRS AGO"   CHRONIC MASTITIS SURG  . CARDIAC CATHETERIZATION    . CHOLECYSTECTOMY    . JOINT REPLACEMENT  2012   RT HIP  . SHOULDER ARTHROSCOPY WITH ROTATOR CUFF REPAIR Right 07/08/2013   Procedure: RIGHT SHOULDER  ARTHROSCOPY WITH DEBRIDEMENT, Subacromial Decompression;  Surgeon: Kathryne Hitch, MD;  Location: [facility];  Service: Orthopedics;  Laterality: Right;  . STENTED CORONARY ARTERY  2008   Social History   Occupational History  . Not on file.   Social History Main Topics  . Smoking status: Former Smoker    Quit date: 07/05/1959  . Smokeless tobacco: Never Used  . Alcohol use No  . Drug use: No  . Sexual activity: Not on file

## 2016-10-07 ENCOUNTER — Ambulatory Visit (INDEPENDENT_AMBULATORY_CARE_PROVIDER_SITE_OTHER): Payer: Self-pay

## 2016-10-07 ENCOUNTER — Ambulatory Visit (INDEPENDENT_AMBULATORY_CARE_PROVIDER_SITE_OTHER): Payer: Medicare Other | Admitting: Physical Medicine and Rehabilitation

## 2016-10-07 ENCOUNTER — Encounter (INDEPENDENT_AMBULATORY_CARE_PROVIDER_SITE_OTHER): Payer: Self-pay | Admitting: Physical Medicine and Rehabilitation

## 2016-10-07 VITALS — BP 127/68 | HR 92 | Temp 98.1°F

## 2016-10-07 DIAGNOSIS — M5416 Radiculopathy, lumbar region: Secondary | ICD-10-CM | POA: Diagnosis not present

## 2016-10-07 DIAGNOSIS — M5116 Intervertebral disc disorders with radiculopathy, lumbar region: Secondary | ICD-10-CM

## 2016-10-07 MED ORDER — METHYLPREDNISOLONE ACETATE 80 MG/ML IJ SUSP
80.0000 mg | Freq: Once | INTRAMUSCULAR | Status: AC
  Administered 2016-10-07: 80 mg

## 2016-10-07 MED ORDER — LIDOCAINE HCL (PF) 1 % IJ SOLN
2.0000 mL | Freq: Once | INTRAMUSCULAR | Status: AC
  Administered 2016-10-07: 2 mL

## 2016-10-07 NOTE — Patient Instructions (Signed)

## 2016-10-07 NOTE — Progress Notes (Deleted)
Patient is here today for planned right L5 transforaminal injection. No change in symptoms.  

## 2016-10-09 NOTE — Procedures (Signed)
[name] is a 81 year old female who is here for planned right L5 transforaminal epidural steroid injection for right radicular leg pain. Please see our prior evaluation and management note for further details and justification. Brief exam shows good distal strength. Lumbosacral Transforaminal Epidural Steroid Injection - Infraneural Approach with Fluoroscopic Guidance  Patient: Megan Wong      Date of Birth: 1935/06/13 MRN: [id number] PCP: Dalbert MayotteSnider, Alison, MD      Visit Date: 10/07/2016   Universal Protocol:     Consent Given By: the patient  Position: PRONE   Additional Comments: Vital signs were monitored before and after the procedure. Patient was prepped and draped in the usual sterile fashion. The correct patient, procedure, and site was verified.   Injection Procedure Details:  Procedure Site One Meds Administered:  Meds ordered this encounter  Medications  . lidocaine (PF) (XYLOCAINE) 1 % injection 2 mL  . methylPREDNISolone acetate (DEPO-MEDROL) injection 80 mg      Laterality: Right  Location/Site:  L5-S1  Needle size: 22 G  Needle type: Spinal  Needle Placement: Transforaminal  Findings:  -Contrast Used: 0.5 mL iohexol 180 mg iodine/mL   -Comments: Excellent flow of contrast along the nerve and into the epidural space.  Procedure Details: After squaring off the end-plates of the desired vertebral level to get a true AP view, the C-arm was obliqued to the painful side so that the superior articulating process is positioned about 1/3 the length of the inferior endplate.  The needle was aimed toward the junction of the superior articular process and the transverse process of the inferior vertebrae. The needle's initial entry is in the lower third of the foramen through Kambin's triangle. The soft tissues overlying this target were infiltrated with 2-3 ml. of 1% Lidocaine without Epinephrine.  The spinal needle was then inserted and advanced toward the target  using a "trajectory" view along the fluoroscope beam.  Under AP and lateral visualization, the needle was advanced so it did not puncture dura and did not traverse medially beyond the 6 o'clock position of the pedicle. Bi-planar projections were used to confirm position. Aspiration was confirmed to be negative for CSF and/or blood. A 1-2 ml. volume of Isovue-250 was injected and flow of contrast was noted at each level. Radiographs were obtained for documentation purposes.   After attaining the desired flow of contrast documented above, a 0.5 to 1.0 ml test dose of 0.25% Marcaine was injected into each respective transforaminal space.  The patient was observed for 90 seconds post injection.  After no sensory deficits were reported, and normal lower extremity motor function was noted,   the above injectate was administered so that equal amounts of the injectate were placed at each foramen (level) into the transforaminal epidural space.   Additional Comments:  The patient tolerated the procedure well Dressing: Band-Aid    Post-procedure details: Patient was observed during the procedure. Post-procedure instructions were reviewed.  Patient left the clinic in stable condition.

## 2016-10-16 ENCOUNTER — Telehealth (INDEPENDENT_AMBULATORY_CARE_PROVIDER_SITE_OTHER): Payer: Self-pay

## 2016-10-16 NOTE — Telephone Encounter (Signed)
Pt called stating she has had no relief with the injection she had on 10/07/16 and wants to know what else you recommend. Had Rt L5-S1 TF.

## 2016-10-20 NOTE — Telephone Encounter (Signed)
Pt scheduled for 10/30/16 @ 3:15. Need to check for precert.

## 2016-10-20 NOTE — Telephone Encounter (Signed)
Right L5-S1 facet

## 2016-10-21 NOTE — Telephone Encounter (Signed)
No precert required [facility] RUE#[id number]#[id number]

## 2016-10-30 ENCOUNTER — Ambulatory Visit (INDEPENDENT_AMBULATORY_CARE_PROVIDER_SITE_OTHER): Payer: Medicare Other

## 2016-10-30 ENCOUNTER — Ambulatory Visit (INDEPENDENT_AMBULATORY_CARE_PROVIDER_SITE_OTHER): Payer: Medicare Other | Admitting: Physical Medicine and Rehabilitation

## 2016-10-30 ENCOUNTER — Encounter (INDEPENDENT_AMBULATORY_CARE_PROVIDER_SITE_OTHER): Payer: Self-pay | Admitting: Physical Medicine and Rehabilitation

## 2016-10-30 VITALS — BP 138/65 | HR 77

## 2016-10-30 DIAGNOSIS — M47816 Spondylosis without myelopathy or radiculopathy, lumbar region: Secondary | ICD-10-CM

## 2016-10-30 DIAGNOSIS — M5416 Radiculopathy, lumbar region: Secondary | ICD-10-CM | POA: Diagnosis not present

## 2016-10-30 MED ORDER — LIDOCAINE HCL (PF) 1 % IJ SOLN
2.0000 mL | Freq: Once | INTRAMUSCULAR | Status: AC
  Administered 2016-10-30: 2 mL

## 2016-10-30 MED ORDER — METHYLPREDNISOLONE ACETATE 80 MG/ML IJ SUSP
80.0000 mg | Freq: Once | INTRAMUSCULAR | Status: AC
  Administered 2016-10-30: 80 mg

## 2016-10-30 NOTE — Patient Instructions (Signed)

## 2016-10-30 NOTE — Progress Notes (Deleted)
No relief with injection she had 3 weeks ago. Right side lower back pain and says it is moving to the left side some. Radiates down right leg to top of foot. Denies numbness and tingling. Constant pain. Worse with sitting.

## 2016-10-30 NOTE — Progress Notes (Unsigned)
Fluoro Time: 19 sec MGy:  18.48

## 2016-11-03 NOTE — Procedures (Signed)
Megan Wong is a pleasant 81 year old female that we recently saw for right L5 transforaminal epidural steroid injection. She got absolutely no relief from that injection and she is having pretty classic radicular pain down to the foot. Reviewing this with her today it may be more of an S1 distribution but he would think an L5 injection would help. I do think it would be wise one time to complete an S1 transforaminal injection diagnostically. MRI from 2016 shows pretty bad facet arthropathy at L5-S1 with small left foraminal protrusion. She could have a new protrusion at L5-S1 which is more centralized causing more of an S1 problem on the right. Prior left-sided symptoms were successfully treated in the past with epidural. Conversely she could have a facet joint type syndrome with radicular pattern from the facet joint.  S1 Lumbosacral Transforaminal Epidural Steroid Injection - Sub-Pedicular Approach with Fluoroscopic Guidance   Patient: Megan Wong      Date of Birth: 06-May-1935 MRN: [id number] PCP: Etta QuillBryan, Daniel J, PA-C      Visit Date: 10/30/2016   Universal Protocol:    Date/Time: 08/13/188:15 AM  Consent Given By: the patient  Position:  PRONE  Additional Comments: Vital signs were monitored before and after the procedure. Patient was prepped and draped in the usual sterile fashion. The correct patient, procedure, and site was verified.   Injection Procedure Details:  Procedure Site One Meds Administered:  Meds ordered this encounter  Medications  . lidocaine (PF) (XYLOCAINE) 1 % injection 2 mL  . methylPREDNISolone acetate (DEPO-MEDROL) injection 80 mg    Laterality: Right  Location/Site:  S1 Foramen   Needle size: 22 G  Needle type: Spinal  Needle Placement: Transforaminal  Findings:  -Contrast Used: 1 mL iohexol 180 mg iodine/mL   -Comments: Excellent flow of contrast along the nerve and into the epidural space.  Procedure Details: After squaring off the sacral  end-plate to get a true AP view, the C-arm was positioned so that the best possible view of the S1 foramen was visualized. The soft tissues overlying this structure were infiltrated with 2-3 ml. of 1% Lidocaine without Epinephrine.    The spinal needle was inserted toward the target using a "trajectory" view along the fluoroscope beam.  Under AP and lateral visualization, the needle was advanced so it did not puncture dura. Biplanar projections were used to confirm position. Aspiration was confirmed to be negative for CSF and/or blood. A 1-2 ml. volume of Isovue-250 was injected and flow of contrast was noted at each level. Radiographs were obtained for documentation purposes.   After attaining the desired flow of contrast documented above, a 0.5 to 1.0 ml test dose of 0.25% Marcaine was injected into each respective transforaminal space.  The patient was observed for 90 seconds post injection.  After no sensory deficits were reported, and normal lower extremity motor function was noted,   the above injectate was administered so that equal amounts of the injectate were placed at each foramen (level) into the transforaminal epidural space.   Additional Comments:  The patient tolerated the procedure well Dressing: Band-Aid    Post-procedure details: Patient was observed during the procedure. Post-procedure instructions were reviewed.  Patient left the clinic in stable condition.

## 2016-11-10 ENCOUNTER — Telehealth (INDEPENDENT_AMBULATORY_CARE_PROVIDER_SITE_OTHER): Payer: Self-pay | Admitting: Physical Medicine and Rehabilitation

## 2016-11-11 NOTE — Telephone Encounter (Signed)
Scheduled for 11/20/16 at 1500.

## 2016-11-11 NOTE — Telephone Encounter (Signed)
Ok to do left side again and we can talk

## 2016-11-20 ENCOUNTER — Ambulatory Visit (INDEPENDENT_AMBULATORY_CARE_PROVIDER_SITE_OTHER): Payer: Medicare Other | Admitting: Physical Medicine and Rehabilitation

## 2016-11-20 ENCOUNTER — Ambulatory Visit (INDEPENDENT_AMBULATORY_CARE_PROVIDER_SITE_OTHER): Payer: Self-pay

## 2016-11-20 ENCOUNTER — Encounter (INDEPENDENT_AMBULATORY_CARE_PROVIDER_SITE_OTHER): Payer: Self-pay | Admitting: Physical Medicine and Rehabilitation

## 2016-11-20 VITALS — BP 142/77 | HR 78 | Temp 97.9°F

## 2016-11-20 DIAGNOSIS — M5416 Radiculopathy, lumbar region: Secondary | ICD-10-CM | POA: Diagnosis not present

## 2016-11-20 MED ORDER — LIDOCAINE HCL (PF) 1 % IJ SOLN
2.0000 mL | Freq: Once | INTRAMUSCULAR | Status: AC
  Administered 2016-11-20: 2 mL

## 2016-11-20 MED ORDER — BETAMETHASONE SOD PHOS & ACET 6 (3-3) MG/ML IJ SUSP
12.0000 mg | Freq: Once | INTRAMUSCULAR | Status: AC
  Administered 2016-11-20: 12 mg

## 2016-11-20 NOTE — Progress Notes (Signed)
Megan Wong - 81 y.o. female MRN [id number]  Date of birth: 21-Mar-1936  Office Visit Note: Visit Date: 11/20/2016 PCP: Etta QuillBryan, Daniel J, PA-C Referred by: Etta QuillBryan, Daniel J, PA-C  Subjective: Chief Complaint  Patient presents with  . Lower Back - Pain  . Right Leg - Pain   HPI: [name] is a 81 year old female with chronic recalcitrant now right radicular leg pain. Her history is such that we saw her in 2016 a complete a left L5 transforaminal injection with really good relief of her symptoms for what was a very small or left-sided foraminal protrusion. We saw her a couple months ago for this right radicular pain which is in a pretty classic L5 distribution and this is unchanged in terms of her clinical complaints. He is worse with standing and ambulating better at rest. She's had no focal weakness or other trauma. She has not had new MRI. We completed right L5 transforaminal injection with no relief at all. Reviewing those images today however show that this was probably very stenotic flow of contrast in May of just not had good flow of medication along the nerve root. We followed up that injection with an S1 transforaminal injection just based on her symptoms and MRI findings from 2016. The S1 transforaminal injection was very well-placed and did give her a small amount of relief but nothing significant. She is followed by Dr. Magnus IvanBlackman is really failed conservative care including medication management and time.    Review of Systems  Constitutional: Negative for chills, fever, malaise/fatigue and weight loss.  HENT: Negative for hearing loss and sinus pain.   Eyes: Negative for blurred vision, double vision and photophobia.  Respiratory: Negative for cough and shortness of breath.   Cardiovascular: Negative for chest pain, palpitations and leg swelling.  Gastrointestinal: Negative for abdominal pain, nausea and vomiting.  Genitourinary: Negative for flank pain.  Musculoskeletal: Positive  for back pain. Negative for myalgias.       Right leg pain  Skin: Negative for itching and rash.  Neurological: Negative for tremors, focal weakness and weakness.  Endo/Heme/Allergies: Negative.   Psychiatric/Behavioral: Negative for depression.  All other systems reviewed and are negative.  Otherwise per HPI.  Assessment & Plan: Visit Diagnoses:  1. Lumbar radiculopathy     Plan: Findings:  Chronic worsening right radicular leg pain and a fairly classic L5 distribution with history of spondylitic spurring and facet arthropathy which is severe at L5-S1 with bi-foraminal narrowing. Prior left L5 injection several years ago gave her excellent relief with this was more of a small disc protrusion. She may indeed be having a new small disc protrusion at her pain is just not responding to epidural injection at this point. She has classic L5 distribution pain on the right. The first right L5 transfer L injections just does not look as well-placed as we would've liked and I think repeating this is worthwhile one time. S1 transforaminal injection was well-placed and did give her a small amount relief. We are going to repeat the right L5 transforaminal injection today and try to get good flow of contrast to at least say for sure whether this is going to help or not. If it doesn't help I would regroup with a new MRI of the lumbar spine. We also talked about starting the gabapentin. I would hold off on medication changes until we see this one injection with good flow and see how she does with that.    Meds & Orders:  Meds ordered this encounter  Medications  . lidocaine (PF) (XYLOCAINE) 1 % injection 2 mL  . betamethasone acetate-betamethasone sodium phosphate (CELESTONE) injection 12 mg    Orders Placed This Encounter  Procedures  . XR C-ARM NO REPORT  . Epidural Steroid injection    Follow-up: Return if symptoms worsen or fail to improve.   Procedures: No procedures performed  Lumbosacral  Transforaminal Epidural Steroid Injection - Sub-Pedicular Approach with Fluoroscopic Guidance  Patient: Megan Wong      Date of Birth: Dec 25, 1935 MRN: [id number] PCP: Etta Quill, PA-C      Visit Date: 11/20/2016   Universal Protocol:    Date/Time: 11/20/2016  Consent Given By: the patient  Position: PRONE  Additional Comments: Vital signs were monitored before and after the procedure. Patient was prepped and draped in the usual sterile fashion. The correct patient, procedure, and site was verified.   Injection Procedure Details:  Procedure Site One Meds Administered:  Meds ordered this encounter  Medications  . lidocaine (PF) (XYLOCAINE) 1 % injection 2 mL  . betamethasone acetate-betamethasone sodium phosphate (CELESTONE) injection 12 mg    Laterality: Right  Location/Site:  L5-S1  Needle size: 22 G  Needle type: Spinal  Needle Placement: Transforaminal  Findings:  -Contrast Used: 1 mL iohexol 180 mg iodine/mL   -Comments: Excellent flow of contrast along the nerve and into the epidural space.  Procedure Details: After squaring off the end-plates to get a true AP view, the C-arm was positioned so that an oblique view of the foramen as noted above was visualized. The target area is just inferior to the "nose of the scotty dog" or sub pedicular. The soft tissues overlying this structure were infiltrated with 2-3 ml. of 1% Lidocaine without Epinephrine.  The spinal needle was inserted toward the target using a "trajectory" view along the fluoroscope beam.  Under AP and lateral visualization, the needle was advanced so it did not puncture dura and was located close the 6 O'Clock position of the pedical in AP tracterory. Biplanar projections were used to confirm position. Aspiration was confirmed to be negative for CSF and/or blood. A 1-2 ml. volume of Isovue-250 was injected and flow of contrast was noted at each level. Radiographs were obtained for documentation  purposes.   After attaining the desired flow of contrast documented above, a 0.5 to 1.0 ml test dose of 0.25% Marcaine was injected into each respective transforaminal space.  The patient was observed for 90 seconds post injection.  After no sensory deficits were reported, and normal lower extremity motor function was noted,   the above injectate was administered so that equal amounts of the injectate were placed at each foramen (level) into the transforaminal epidural space.   Additional Comments:  The patient tolerated the procedure well Dressing: Band-Aid    Post-procedure details: Patient was observed during the procedure. Post-procedure instructions were reviewed.  Patient left the clinic in stable condition.       Clinical History: MRI LUMBAR SPINE WITHOUT CONTRAST 02/20/2015  TECHNIQUE: Multiplanar, multisequence MR imaging of the lumbar spine was performed. No intravenous contrast was administered.  COMPARISON: 12/31/2010  FINDINGS: No marrow signal abnormality suggestive of fracture, infection, or neoplasm. Normal conus signal and morphology. No perispinal abnormality to explain back pain. Bilateral renal sinus cysts are noted.  Degenerative changes:  T12- L1: Spondylosis without herniation or impingement.  L1-L2: Ventral spondylotic spurring. No herniation or impingement. Negative facets.  L2-L3: Bulky ventral spondylotic spurring. No herniation. No  impingement. Negative facets.  L3-L4: Spondylotic endplate spurring and mild annulus bulging. No disc herniation. Facet arthropathy, mild, with ligamentum flavum thickening.  L4-L5: Mild annulus bulging. Degenerative ligamentous overgrowth and facet spurring. No herniation or impingement.  L5-S1:Annulus bulging and spondylotic spurring. Most advanced level of facet arthropathy with advanced joint narrowing and bilateral facet spurring. Small left foraminal protrusion, chronic and without neural  impingement.  IMPRESSION: Lower lumbar facet arthropathy with milder disc degeneration, described above and stable from 2012. No compressive stenosis.  She reports that she quit smoking about 57 years ago. She has never used smokeless tobacco. No results for input(s): HGBA1C, LABURIC in the last 8760 hours.  Objective:  VS:  HT:    WT:   BMI:     BP:(!) 142/77  HR:78bpm  TEMP:97.9 F (36.6 C)(Oral)  RESP:100 % Physical Exam  Constitutional: She is oriented to person, place, and time. She appears well-developed and well-nourished.  Eyes: Pupils are equal, round, and reactive to light. Conjunctivae and EOM are normal.  Cardiovascular: Normal rate and intact distal pulses.   Pulmonary/Chest: Effort normal.  Musculoskeletal:  Patient ambulates without aid. Somewhat slow to rise from a seated position and she does have pain with extension rotation of the lumbar spine. She has an equivocally positive slump test on the right. She has good distal strength without clonus. She has good dorsiflexion plantarflexion EHL.  Neurological: She is alert and oriented to person, place, and time. She exhibits normal muscle tone.  Skin: Skin is warm and dry. No rash noted. No erythema.  Psychiatric: She has a normal mood and affect. Her behavior is normal.  Nursing note and vitals reviewed.   Ortho Exam Imaging: No results found.  Past Medical/Family/Surgical/Social History: Medications & Allergies reviewed per EMR Patient Active Problem List   Diagnosis Date Noted  . Incomplete rotator cuff tear right shoulder 07/08/2013   Past Medical History:  Diagnosis Date  . Arthritis   . Coronary artery disease    STENT 7 YRS AGO - followed by Dr. Leeann Must in [place]   . Fibromyalgia   . History of nonmelanoma skin cancer   . History of palpitations    takes Metoprolol - pt denies any Hx HBP  . RCT (rotator cuff tear)    RT SHOULDER  . Rupture of biceps tendon    RT   History reviewed. No  pertinent family history. Past Surgical History:  Procedure Laterality Date  . BREAST SURGERY  "MANY YRS AGO"   CHRONIC MASTITIS SURG  . CARDIAC CATHETERIZATION    . CHOLECYSTECTOMY    . JOINT REPLACEMENT  2012   RT HIP  . SHOULDER ARTHROSCOPY WITH ROTATOR CUFF REPAIR Right 07/08/2013   Procedure: RIGHT SHOULDER ARTHROSCOPY WITH DEBRIDEMENT, Subacromial Decompression;  Surgeon: Kathryne Hitch, MD;  Location: [facility];  Service: Orthopedics;  Laterality: Right;  . STENTED CORONARY ARTERY  2008   Social History   Occupational History  . Not on file.   Social History Main Topics  . Smoking status: Former Smoker    Quit date: 07/05/1959  . Smokeless tobacco: Never Used  . Alcohol use No  . Drug use: No  . Sexual activity: Not on file

## 2016-11-20 NOTE — Progress Notes (Deleted)
Pain in both buttocks and down back of legs to feet. Worse on left side. Worse with sitting. Walking helps.

## 2016-11-20 NOTE — Patient Instructions (Signed)

## 2016-11-26 ENCOUNTER — Encounter (INDEPENDENT_AMBULATORY_CARE_PROVIDER_SITE_OTHER): Payer: Self-pay | Admitting: Physical Medicine and Rehabilitation

## 2016-11-26 NOTE — Procedures (Signed)
Lumbosacral Transforaminal Epidural Steroid Injection - Sub-Pedicular Approach with Fluoroscopic Guidance  Patient: Megan Wong      Date of Birth: 1935/11/05 MRN: [id number] PCP: Etta QuillBryan, Daniel J, PA-C      Visit Date: 11/20/2016   Universal Protocol:    Date/Time: 11/20/2016  Consent Given By: the patient  Position: PRONE  Additional Comments: Vital signs were monitored before and after the procedure. Patient was prepped and draped in the usual sterile fashion. The correct patient, procedure, and site was verified.   Injection Procedure Details:  Procedure Site One Meds Administered:  Meds ordered this encounter  Medications  . lidocaine (PF) (XYLOCAINE) 1 % injection 2 mL  . betamethasone acetate-betamethasone sodium phosphate (CELESTONE) injection 12 mg    Laterality: Right  Location/Site:  L5-S1  Needle size: 22 G  Needle type: Spinal  Needle Placement: Transforaminal  Findings:  -Contrast Used: 1 mL iohexol 180 mg iodine/mL   -Comments: Excellent flow of contrast along the nerve and into the epidural space.  Procedure Details: After squaring off the end-plates to get a true AP view, the C-arm was positioned so that an oblique view of the foramen as noted above was visualized. The target area is just inferior to the "nose of the scotty dog" or sub pedicular. The soft tissues overlying this structure were infiltrated with 2-3 ml. of 1% Lidocaine without Epinephrine.  The spinal needle was inserted toward the target using a "trajectory" view along the fluoroscope beam.  Under AP and lateral visualization, the needle was advanced so it did not puncture dura and was located close the 6 O'Clock position of the pedical in AP tracterory. Biplanar projections were used to confirm position. Aspiration was confirmed to be negative for CSF and/or blood. A 1-2 ml. volume of Isovue-250 was injected and flow of contrast was noted at each level. Radiographs were obtained for  documentation purposes.   After attaining the desired flow of contrast documented above, a 0.5 to 1.0 ml test dose of 0.25% Marcaine was injected into each respective transforaminal space.  The patient was observed for 90 seconds post injection.  After no sensory deficits were reported, and normal lower extremity motor function was noted,   the above injectate was administered so that equal amounts of the injectate were placed at each foramen (level) into the transforaminal epidural space.   Additional Comments:  The patient tolerated the procedure well Dressing: Band-Aid    Post-procedure details: Patient was observed during the procedure. Post-procedure instructions were reviewed.  Patient left the clinic in stable condition.

## 2016-12-09 ENCOUNTER — Telehealth (INDEPENDENT_AMBULATORY_CARE_PROVIDER_SITE_OTHER): Payer: Self-pay | Admitting: Physical Medicine and Rehabilitation

## 2016-12-09 DIAGNOSIS — M5416 Radiculopathy, lumbar region: Secondary | ICD-10-CM

## 2016-12-10 NOTE — Telephone Encounter (Signed)
Left message for patient to call back to discuss.

## 2016-12-10 NOTE — Telephone Encounter (Signed)
Order placed

## 2016-12-10 NOTE — Telephone Encounter (Signed)
She needs updated MRI Lspine

## 2016-12-25 ENCOUNTER — Ambulatory Visit
Admission: RE | Admit: 2016-12-25 | Discharge: 2016-12-25 | Disposition: A | Discharge status: Home / Self Care | Payer: Medicare Other | Source: Ambulatory Visit | Attending: Physical Medicine and Rehabilitation | Admitting: Physical Medicine and Rehabilitation

## 2016-12-25 DIAGNOSIS — M5416 Radiculopathy, lumbar region: Secondary | ICD-10-CM

## 2017-01-06 ENCOUNTER — Ambulatory Visit (INDEPENDENT_AMBULATORY_CARE_PROVIDER_SITE_OTHER): Payer: Medicare Other | Admitting: Physical Medicine and Rehabilitation

## 2017-01-06 ENCOUNTER — Ambulatory Visit (INDEPENDENT_AMBULATORY_CARE_PROVIDER_SITE_OTHER): Payer: Medicare Other

## 2017-01-06 ENCOUNTER — Encounter (INDEPENDENT_AMBULATORY_CARE_PROVIDER_SITE_OTHER): Payer: Self-pay | Admitting: Physical Medicine and Rehabilitation

## 2017-01-06 VITALS — BP 142/71 | HR 72

## 2017-01-06 DIAGNOSIS — G894 Chronic pain syndrome: Secondary | ICD-10-CM

## 2017-01-06 DIAGNOSIS — M545 Low back pain, unspecified: Secondary | ICD-10-CM

## 2017-01-06 DIAGNOSIS — M7061 Trochanteric bursitis, right hip: Secondary | ICD-10-CM

## 2017-01-06 DIAGNOSIS — Z96641 Presence of right artificial hip joint: Secondary | ICD-10-CM

## 2017-01-06 DIAGNOSIS — M47816 Spondylosis without myelopathy or radiculopathy, lumbar region: Secondary | ICD-10-CM | POA: Diagnosis not present

## 2017-01-06 DIAGNOSIS — G8929 Other chronic pain: Secondary | ICD-10-CM | POA: Diagnosis not present

## 2017-01-06 DIAGNOSIS — M797 Fibromyalgia: Secondary | ICD-10-CM

## 2017-01-06 NOTE — Progress Notes (Deleted)
Here for MRI review. No changes with pain in lower back, bilateral hips and legs. Right side is worse. Pain radiates down back of legs to feet. RA fibro.

## 2017-01-21 ENCOUNTER — Encounter (INDEPENDENT_AMBULATORY_CARE_PROVIDER_SITE_OTHER): Payer: Self-pay | Admitting: Physical Medicine and Rehabilitation

## 2017-01-21 DIAGNOSIS — M7061 Trochanteric bursitis, right hip: Secondary | ICD-10-CM

## 2017-01-21 DIAGNOSIS — G894 Chronic pain syndrome: Secondary | ICD-10-CM | POA: Insufficient documentation

## 2017-01-21 DIAGNOSIS — M797 Fibromyalgia: Secondary | ICD-10-CM | POA: Insufficient documentation

## 2017-01-21 DIAGNOSIS — Z96641 Presence of right artificial hip joint: Secondary | ICD-10-CM | POA: Insufficient documentation

## 2017-01-21 DIAGNOSIS — M47816 Spondylosis without myelopathy or radiculopathy, lumbar region: Secondary | ICD-10-CM | POA: Insufficient documentation

## 2017-01-21 MED ORDER — BUPIVACAINE HCL 0.25 % IJ SOLN
4.0000 mL | INTRAMUSCULAR | Status: AC | PRN
  Administered 2017-01-21: 4 mL via INTRA_ARTICULAR

## 2017-01-21 MED ORDER — LIDOCAINE HCL 2 % IJ SOLN
4.0000 mL | INTRAMUSCULAR | Status: AC | PRN
  Administered 2017-01-21: 4 mL

## 2017-01-21 MED ORDER — TRIAMCINOLONE ACETONIDE 40 MG/ML IJ SUSP
80.0000 mg | INTRAMUSCULAR | Status: AC | PRN
  Administered 2017-01-21: 80 mg via INTRA_ARTICULAR

## 2017-01-21 NOTE — Progress Notes (Addendum)
Megan Wong - 81 y.o. female MRN [id number]  Date of birth: 11/16/1935  Office Visit Note: Visit Date: 01/06/2017 PCP: Etta Quill, PA-C Referred by: Etta Quill, PA-C  Subjective: Chief Complaint  Patient presents with  . Lower Back - Pain  . Right Hip - Pain  . Right Leg - Pain  . Left Leg - Pain   HPI: Mrs. Kobel is a 81 year old female that we have seen on a few occasions.  She is followed by Dr. Magnus Ivan in our office for her orthopedic care.  She has had history of shoulder surgery and right total hip replacement.  We have been seeing her with chronic low back pain and bilateral radicular type leg pain down to the feet.  She is traditionally had one side worse than the other but now it is more bilateral.  It used to be more right side it is a little bit worse on the right.  Her history is complicated by fibromyalgia and chronic pain syndrome.  Injections recently if not been very beneficial.  She had more of a classic L5 distribution pain.  We ended up updating an MRI of the lumbar spine and she is here for review of that today.  That is reviewed with her below we did show her the images.  She really has mainly multilevel spondylosis without stenosis or focal herniation.  There is a small left foraminal protrusion at L5-S1 but it is without neural impingement.  There is significant facet arthropathy at L4-5 and L5-S1 bilaterally.  She reports today more pain on the right hip and leg down to the knee.  Is worse with laying on that side.  She denies any focal weakness.  She does get the symptoms down the legs but without true paresthesias.  She does have some pain all over.  She is finding it difficult to stand and walk.  She does feel like it limits what she could do.  She said no fevers chills or night sweats.  No focal trauma.  Again more pain over the right greater trochanter today.  The medications have not been helpful.    Review of Systems  Constitutional: Positive for  malaise/fatigue. Negative for chills, fever and weight loss.  HENT: Negative for hearing loss and sinus pain.   Eyes: Negative for blurred vision, double vision and photophobia.  Respiratory: Negative for cough and shortness of breath.   Cardiovascular: Negative for chest pain, palpitations and leg swelling.  Gastrointestinal: Negative for abdominal pain, nausea and vomiting.  Genitourinary: Negative for flank pain.  Musculoskeletal: Positive for back pain and joint pain. Negative for myalgias.  Skin: Negative for itching and rash.  Neurological: Negative for tremors, focal weakness and weakness.  Endo/Heme/Allergies: Negative.   Psychiatric/Behavioral: Negative for depression.  All other systems reviewed and are negative.  Otherwise per HPI.  Assessment & Plan: Visit Diagnoses:  1. Trochanteric bursitis, right hip   2. Spondylosis without myelopathy or radiculopathy, lumbar region   3. Chronic bilateral low back pain without sciatica   4. Chronic pain syndrome   5. Fibromyalgia   6. H/O total hip arthroplasty, right     Plan: Findings:  Complicated chronic pain history with real orthopedic problems including shoulder surgery and right total hip arthroplasty facet arthropathy of the lumbar spine.  The spine findings especially on updated MRI would not explain any real radicular pain down the legs.  She could be getting a facet joint syndrome where the pain referral  pattern is further than he would expect.  This might go along with fibromyalgia as well which we know interferes with some central processing in the brain in terms of pain signals.  Obviously her symptoms could be related just to the fibromyalgia but I think she is having real symptoms mechanical standpoint.  Today it also feels like she has more of a greater trochanteric bursitis on the right.  She is somewhat tender and has tender points but is more exquisite over the right greater trochanter.  I think we could complete a  diagnostic right greater trochanteric injection with fluoroscopic guidance today just to see how much that helps her.  She will give this a little bit of time to work and she will call us back.  Another avenue of approach would be medial branch blocks of the lower facet joints to at least try to help her back pain.  She would be a great candidate for radiofrequency ablation.  I am not sure this will help the leg pain however.  We discussed this at length today.  The total duration of the office visit was 30 minutes and we spent more than 50 %reviewing MRI and discussing facet arthropathy and referred pain and fibromyalgia.  We also discussed further options for her.    Meds & Orders: No orders of the defined types were placed in this encounter.   Orders Placed This Encounter  Procedures  . Large Joint Injection/Arthrocentesis  . XR C-ARM NO REPORT    Follow-up: Return if symptoms worsen or fail to improve.   Procedures: Greater trochanteric bursa injection with fluoroscopic guidance Date/Time: 01/21/2017 5:36 AM Performed by: Tyrell AntonioNEWTON, Gurjit Loconte Authorized by: Tyrell AntonioNEWTON, Broox Lonigro   Consent Given by:  Parent Site marked: the procedure site was marked   Timeout: prior to procedure the correct patient, procedure, and site was verified   Indications:  Pain and diagnostic evaluation Location:  Hip Site:  R greater trochanter Prep: patient was prepped and draped in usual sterile fashion   Needle Size:  22 G Needle Length:  3.5 inches Approach:  Lateral Ultrasound Guidance: No   Fluoroscopic Guidance: Yes   Arthrogram: No   Medications:  80 mg triamcinolone acetonide 40 MG/ML; 4 mL lidocaine 2 %; 4 mL bupivacaine 0.25 % Aspiration Attempted: No   Patient tolerance:  Patient tolerated the procedure well with no immediate complications  There was excellent flow of contrast outlined the greater trochanteric bursa without vascular uptake.    No notes on file   Clinical History: MRI LUMBAR  SPINE WITHOUT CONTRAST 02/20/2015  TECHNIQUE: Multiplanar, multisequence MR imaging of the lumbar spine was performed. No intravenous contrast was administered.  COMPARISON: 12/31/2010  FINDINGS: No marrow signal abnormality suggestive of fracture, infection, or neoplasm. Normal conus signal and morphology. No perispinal abnormality to explain back pain. Bilateral renal sinus cysts are noted.  Degenerative changes:  T12- L1: Spondylosis without herniation or impingement.  L1-L2: Ventral spondylotic spurring. No herniation or impingement. Negative facets.  L2-L3: Bulky ventral spondylotic spurring. No herniation. No impingement. Negative facets.  L3-L4: Spondylotic endplate spurring and mild annulus bulging. No disc herniation. Facet arthropathy, mild, with ligamentum flavum thickening.  L4-L5: Mild annulus bulging. Degenerative ligamentous overgrowth and facet spurring. No herniation or impingement.  L5-S1:Annulus bulging and spondylotic spurring. Most advanced level of facet arthropathy with advanced joint narrowing and bilateral facet spurring. Small left foraminal protrusion, chronic and without neural impingement.  IMPRESSION: Lower lumbar facet arthropathy with milder disc degeneration, described above  and stable from 2012. No compressive stenosis.  She reports that she quit smoking about 57 years ago. She has never used smokeless tobacco. No results for input(s): HGBA1C, LABURIC in the last 8760 hours.  Objective:  VS:  HT:    WT:   BMI:     BP:(!) 142/71  HR:72bpm  TEMP: ( )  RESP:  Physical Exam  Constitutional: She is oriented to person, place, and time. She appears well-developed and well-nourished. No distress.  HENT:  Head: Normocephalic and atraumatic.  Nose: Nose normal.  Mouth/Throat: Oropharynx is clear and moist.  Eyes: Pupils are equal, round, and reactive to light. Conjunctivae are normal.  Neck: Normal range of motion. Neck supple.   Cardiovascular: Regular rhythm and intact distal pulses.   Pulmonary/Chest: Effort normal. No respiratory distress.  Abdominal: She exhibits no distension. There is no guarding.  Musculoskeletal:  Patient ambulates without aid.  She has pain with extension rotation of the lumbar spine concordant with her low back pain.  She has tender points present across the lumbar spine and greater trochanters and PSIS.  She has more pain over the right greater trochanter.  She has no pain with hip rotation.  Left.  The right side is where she had a replacement.  She has good distal strength without clonus.  She has a negative slump test.  Neurological: She is alert and oriented to person, place, and time. She exhibits normal muscle tone. Coordination normal.  Skin: Skin is warm. No rash noted. No erythema.  Psychiatric: She has a normal mood and affect. Her behavior is normal.  Nursing note and vitals reviewed.   Ortho Exam Imaging: No results found.  Past Medical/Family/Surgical/Social History: Medications & Allergies reviewed per EMR Patient Active Problem List   Diagnosis Date Noted  . Fibromyalgia 01/21/2017  . Chronic pain syndrome 01/21/2017  . Spondylosis without myelopathy or radiculopathy, lumbar region 01/21/2017  . H/O total hip arthroplasty, right 01/21/2017  . Incomplete rotator cuff tear right shoulder 07/08/2013   Past Medical History:  Diagnosis Date  . Arthritis   . Coronary artery disease    STENT 7 YRS AGO - followed by Dr. Leeann Must in [place]   . Fibromyalgia   . History of nonmelanoma skin cancer   . History of palpitations    takes Metoprolol - pt denies any Hx HBP  . RCT (rotator cuff tear)    RT SHOULDER  . Rupture of biceps tendon    RT   History reviewed. No pertinent family history. Past Surgical History:  Procedure Laterality Date  . BREAST SURGERY  "MANY YRS AGO"   CHRONIC MASTITIS SURG  . CARDIAC CATHETERIZATION    . CHOLECYSTECTOMY    . JOINT  REPLACEMENT  2012   RT HIP  . SHOULDER ARTHROSCOPY WITH ROTATOR CUFF REPAIR Right 07/08/2013   Procedure: RIGHT SHOULDER ARTHROSCOPY WITH DEBRIDEMENT, Subacromial Decompression;  Surgeon: Kathryne Hitch, MD;  Location: [facility];  Service: Orthopedics;  Laterality: Right;  . STENTED CORONARY ARTERY  2008   Social History   Occupational History  . Not on file.   Social History Main Topics  . Smoking status: Former Smoker    Quit date: 07/05/1959  . Smokeless tobacco: Never Used  . Alcohol use No  . Drug use: No  . Sexual activity: Not on file

## 2017-08-31 ENCOUNTER — Ambulatory Visit (INDEPENDENT_AMBULATORY_CARE_PROVIDER_SITE_OTHER): Payer: Medicare Other | Admitting: Physician Assistant

## 2017-08-31 ENCOUNTER — Encounter (INDEPENDENT_AMBULATORY_CARE_PROVIDER_SITE_OTHER): Payer: Self-pay | Admitting: Physician Assistant

## 2017-08-31 ENCOUNTER — Other Ambulatory Visit (INDEPENDENT_AMBULATORY_CARE_PROVIDER_SITE_OTHER): Payer: Self-pay

## 2017-08-31 DIAGNOSIS — G5603 Carpal tunnel syndrome, bilateral upper limbs: Secondary | ICD-10-CM | POA: Diagnosis not present

## 2017-08-31 DIAGNOSIS — M79642 Pain in left hand: Secondary | ICD-10-CM

## 2017-08-31 DIAGNOSIS — M79641 Pain in right hand: Secondary | ICD-10-CM

## 2017-08-31 NOTE — Progress Notes (Signed)
Office Visit Note   Patient: Megan Wong           Date of Birth: 02/02/1936           MRN: [id number] Visit Date: 08/31/2017              Requested by: Etta QuillBryan, Daniel J, PA-C [address] [place], [place] [id number] PCP: Etta QuillBryan, Daniel J, PA-C   Assessment & Plan: Visit Diagnoses:  1. Carpal tunnel syndrome, bilateral upper limbs     Plan: We will have her undergo EMG nerve conduction studies to rule out carpal tunnel syndrome bilaterally.  She will follow-up after the studies to go over results and discuss further treatment.  Questions encouraged and answered at length.  Follow-Up Instructions: Return for after [place]/EMG Studies.   Orders:  No orders of the defined types were placed in this encounter.  No orders of the defined types were placed in this encounter.     Procedures: No procedures performed   Clinical Data: No additional findings.   Subjective: Chief Complaint  Patient presents with  . Left Hand - Numbness  . Right Hand - Numbness    HPI Is spine is well-known to Dr. Magnus IvanBlackman service comes in today due to bilateral hand numbness tingling for the past 3 to 4 months.  States that her hands go to sleep often.  Does wake her at night.  States that all of the fingers in both hands go numb except for the fifth finger.  She feels her symptoms are getting worse.  Said no treatment for this did see her primary care physician he ran lab work she states that her lab work was all normal.  Both hands bother her equally.  She denies any radicular symptoms down either arm. Review of Systems Negative for chest pain, nausea vomiting, fevers or chills.  Objective: Vital Signs: There were no vitals taken for this visit.  Physical Exam  Constitutional: She is oriented to person, place, and time. She appears well-developed and well-nourished. No distress.  Pulmonary/Chest: Effort normal.  Neurological: She is alert and oriented to person, place, and time.    Skin: She is not diaphoretic.  Psychiatric: She has a normal mood and affect.    Ortho Exam Negative compression test over the median nerve bilaterally.  Positive Phalen's bilaterally positive Tinel's bilaterally at the wrist.  Full motor full sensation bilateral hands.  Radial pulses are 2+.  Bilateral hands without rash skin lesions ulcerations or impending ulcers. Specialty Comments:  No specialty comments available.  Imaging: No results found.   PMFS History: Patient Active Problem List   Diagnosis Date Noted  . Fibromyalgia 01/21/2017  . Chronic pain syndrome 01/21/2017  . Spondylosis without myelopathy or radiculopathy, lumbar region 01/21/2017  . H/O total hip arthroplasty, right 01/21/2017  . Incomplete rotator cuff tear right shoulder 07/08/2013   Past Medical History:  Diagnosis Date  . Arthritis   . Coronary artery disease    STENT 7 YRS AGO - followed by Dr. Leeann Mustenaldo in [place]   . Fibromyalgia   . History of nonmelanoma skin cancer   . History of palpitations    takes Metoprolol - pt denies any Hx HBP  . RCT (rotator cuff tear)    RT SHOULDER  . Rupture of biceps tendon    RT    No family history on file.  Past Surgical History:  Procedure Laterality Date  . BREAST SURGERY  "MANY YRS AGO"  CHRONIC MASTITIS SURG  . CARDIAC CATHETERIZATION    . CHOLECYSTECTOMY    . JOINT REPLACEMENT  2012   RT HIP  . SHOULDER ARTHROSCOPY WITH ROTATOR CUFF REPAIR Right 07/08/2013   Procedure: RIGHT SHOULDER ARTHROSCOPY WITH DEBRIDEMENT, Subacromial Decompression;  Surgeon: Kathryne Hitch, MD;  Location: [facility];  Service: Orthopedics;  Laterality: Right;  . STENTED CORONARY ARTERY  2008   Social History   Occupational History  . Not on file  Tobacco Use  . Smoking status: Former Smoker    Last attempt to quit: 07/05/1959    Years since quitting: 58.1  . Smokeless tobacco: Never Used  Substance and Sexual Activity  . Alcohol use: No  . Drug use: No  .  Sexual activity: Not on file

## 2017-10-01 ENCOUNTER — Ambulatory Visit (INDEPENDENT_AMBULATORY_CARE_PROVIDER_SITE_OTHER): Payer: Medicare Other | Admitting: Physical Medicine and Rehabilitation

## 2017-10-01 DIAGNOSIS — R202 Paresthesia of skin: Secondary | ICD-10-CM | POA: Diagnosis not present

## 2017-10-01 NOTE — Progress Notes (Signed)
Numeric Pain Rating Scale and Functional Assessment Average Pain 9   In the last MONTH (on 0-10 scale) has pain interfered with the following?  1. General activity like being  able to carry out your everyday physical activities such as walking, climbing stairs, carrying groceries, or moving a chair?  Rating(7)   +Driver, -BT, -Dye Allergies. 

## 2017-10-01 NOTE — Progress Notes (Signed)
Megan Wong - 82 y.o. female MRN [id number]  Date of birth: 29-Jun-1935  Office Visit Note: Visit Date: 10/01/2017 PCP: Etta Quill, PA-C Referred by: Etta Quill, PA-C  Subjective: Chief Complaint  Patient presents with  . Right Hand - Pain, Numbness, Tingling  . Left Hand - Numbness, Pain, Tingling   HPI: Megan Wong is a 82 year old right-hand-dominant female that comes in today at the request of Rexene Edison, P.A.-C for electrodiagnostic study of both upper limbs.  She has been having several months of worsening hand pain with bilateral numbness and tingling equal right and left in the radial digits and in fact all digits except the pinky fingers.  She has worsening symptoms at night and her symptoms are quite severe at this point.  Tramadol has helped her pain to a degree.  She rates her pain as a 9 out of 10.  She is starting to get some triggering of the left ring finger.  This is a new finding or new complaint.  She has had no history of prior electrodiagnostic studies.  She has no radicular complaints down the arms.  She said no specific trauma.   ROS Otherwise per HPI.  Assessment & Plan: Visit Diagnoses:  1. Paresthesia of skin     Plan: No additional findings.  Impression: The above electrodiagnostic study is ABNORMAL and reveals evidence of:  1.  A severe right median nerve entrapment at the wrist (carpal tunnel syndrome) affecting sensory and motor components.   2. A moderate left median nerve entrapment at the wrist (carpal tunnel syndrome) affecting sensory and motor components.    There is no significant electrodiagnostic evidence of any other focal nerve entrapment, brachial plexopathy or cervical radiculopathy.   Recommendations: 1.  Follow-up with referring physician. 2.  Continue current management of symptoms. 3.  Continue use of resting splint at night-time and as needed during the day. 4.  Suggest surgical  evaluation.  ___________________________ Elease Hashimoto Board Certified, American Board of Physical Medicine and Rehabilitation   Meds & Orders: No orders of the defined types were placed in this encounter.   Orders Placed This Encounter  Procedures  . NCV with EMG (electromyography)    Follow-up: Return for Rexene Edison, P.A.-C.   Procedures: No procedures performed  EMG & NCV Findings: Evaluation of the left median motor nerve showed prolonged distal onset latency (5.2 ms) and decreased conduction velocity (Elbow-Wrist, 42 m/s).  The right median motor nerve showed prolonged distal onset latency (6.3 ms), reduced amplitude (2.5 mV), and decreased conduction velocity (Elbow-Wrist, 40 m/s).  The left median (across palm) sensory nerve showed prolonged distal peak latency (Wrist, 4.9 ms).  The right median (across palm) sensory nerve showed prolonged distal peak latency (Wrist, 5.9 ms) and prolonged distal peak latency (Palm, 2.4 ms).  All remaining nerves (as indicated in the following tables) were within normal limits.  Left vs. Right side comparison data for the median motor nerve indicates abnormal L-R latency difference (1.1 ms).  All remaining left vs. right side differences were within normal limits.    Needle evaluation of the right abductor pollicis brevis muscle showed slightly increased spontaneous activity.  All remaining muscles (as indicated in the following table) showed no evidence of electrical instability.    Impression: The above electrodiagnostic study is ABNORMAL and reveals evidence of:  1.  A severe right median nerve entrapment at the wrist (carpal tunnel syndrome) affecting sensory and motor components.   2. A  moderate left median nerve entrapment at the wrist (carpal tunnel syndrome) affecting sensory and motor components.    There is no significant electrodiagnostic evidence of any other focal nerve entrapment, brachial plexopathy or cervical radiculopathy.    Recommendations: 1.  Follow-up with referring physician. 2.  Continue current management of symptoms. 3.  Continue use of resting splint at night-time and as needed during the day. 4.  Suggest surgical evaluation.  ___________________________ Naaman Plummer FAAPMR Board Certified, American Board of Physical Medicine and Rehabilitation    Nerve Conduction Studies Anti Sensory Summary Table   Stim Site NR Peak (ms) Norm Peak (ms) P-T Amp (V) Norm P-T Amp Site1 Site2 Delta-P (ms) Dist (cm) Vel (m/s) Norm Vel (m/s)  Left Median Acr Palm Anti Sensory (2nd Digit)  34C  Wrist    *4.9 <3.6 11.4 >10 Wrist Palm 2.9 0.0    Palm    2.0 <2.0 19.9         Right Median Acr Palm Anti Sensory (2nd Digit)  32.8C  Wrist    *5.9 <3.6 12.2 >10 Wrist Palm 3.5 0.0    Palm    *2.4 <2.0 7.1         Left Radial Anti Sensory (Base 1st Digit)  34.2C  Wrist    2.3 <3.1 17.8  Wrist Base 1st Digit 2.3 0.0    Right Radial Anti Sensory (Base 1st Digit)  31.9C  Wrist    2.4 <3.1 30.7  Wrist Base 1st Digit 2.4 0.0    Left Ulnar Anti Sensory (5th Digit)  34.7C  Wrist    3.7 <3.7 19.1 >15.0 Wrist 5th Digit 3.7 14.0 38 >38  Right Ulnar Anti Sensory (5th Digit)  32.4C  Wrist    3.7 <3.7 18.8 >15.0 Wrist 5th Digit 3.7 14.0 38 >38   Motor Summary Table   Stim Site NR Onset (ms) Norm Onset (ms) O-P Amp (mV) Norm O-P Amp Site1 Site2 Delta-0 (ms) Dist (cm) Vel (m/s) Norm Vel (m/s)  Left Median Motor (Abd Poll Brev)  33.7C  Wrist    *5.2 <4.2 5.4 >5 Elbow Wrist 4.5 19.0 *42 >50  Elbow    9.7  5.9         Right Median Motor (Abd Poll Brev)  31.7C  Wrist    *6.3 <4.2 *2.5 >5 Elbow Wrist 4.6 18.5 *40 >50  Elbow    10.9  2.6         Left Ulnar Motor (Abd Dig Min)  33.5C  Wrist    3.0 <4.2 11.1 >3 B Elbow Wrist 3.3 18.0 55 >53  B Elbow    6.3  8.9  A Elbow B Elbow 1.4 10.0 71 >53  A Elbow    7.7  8.5         Right Ulnar Motor (Abd Dig Min)  32.3C  Wrist    2.9 <4.2 9.3 >3 B Elbow Wrist 3.4 19.0 56 >53  B  Elbow    6.3  8.9  A Elbow B Elbow 1.2 10.0 83 >53  A Elbow    7.5  7.7          EMG   Side Muscle Nerve Root Ins Act Fibs Psw Amp Dur Poly Recrt Int Dennie Bible Comment  Right Abd Poll Brev Median C8-T1 Nml *1+ *1+ Nml Nml 0 Nml Nml   Right 1stDorInt Ulnar C8-T1 Nml Nml Nml Nml Nml 0 Nml Nml   Right PronatorTeres Median C6-7 Nml Nml Nml Nml Nml 0  Nml Nml   Right Biceps Musculocut C5-6 Nml Nml Nml Nml Nml 0 Nml Nml   Right Deltoid Axillary C5-6 Nml Nml Nml Nml Nml 0 Nml Nml     Nerve Conduction Studies Anti Sensory Left/Right Comparison   Stim Site L Lat (ms) R Lat (ms) L-R Lat (ms) L Amp (V) R Amp (V) L-R Amp (%) Site1 Site2 L Vel (m/s) R Vel (m/s) L-R Vel (m/s)  Median Acr Palm Anti Sensory (2nd Digit)  34C  Wrist *4.9 *5.9 1.0 11.4 12.2 6.6 Wrist Palm     Palm 2.0 *2.4 0.4 19.9 7.1 64.3       Radial Anti Sensory (Base 1st Digit)  34.2C  Wrist 2.3 2.4 0.1 17.8 30.7 42.0 Wrist Base 1st Digit     Ulnar Anti Sensory (5th Digit)  34.7C  Wrist 3.7 3.7 0.0 19.1 18.8 1.6 Wrist 5th Digit 38 38 0   Motor Left/Right Comparison   Stim Site L Lat (ms) R Lat (ms) L-R Lat (ms) L Amp (mV) R Amp (mV) L-R Amp (%) Site1 Site2 L Vel (m/s) R Vel (m/s) L-R Vel (m/s)  Median Motor (Abd Poll Brev)  33.7C  Wrist *5.2 *6.3 *1.1 5.4 *2.5 53.7 Elbow Wrist *42 *40 2  Elbow 9.7 10.9 1.2 5.9 2.6 55.9       Ulnar Motor (Abd Dig Min)  33.5C  Wrist 3.0 2.9 0.1 11.1 9.3 16.2 B Elbow Wrist 55 56 1  B Elbow 6.3 6.3 0.0 8.9 8.9 0.0 A Elbow B Elbow 71 83 12  A Elbow 7.7 7.5 0.2 8.5 7.7 9.4          Waveforms:                     Clinical History: MRI LUMBAR SPINE WITHOUT CONTRAST 02/20/2015  TECHNIQUE: Multiplanar, multisequence MR imaging of the lumbar spine was performed. No intravenous contrast was administered.  COMPARISON: 12/31/2010  FINDINGS: No marrow signal abnormality suggestive of fracture, infection, or neoplasm. Normal conus signal and morphology. No perispinal abnormality to  explain back pain. Bilateral renal sinus cysts are noted.  Degenerative changes:  T12- L1: Spondylosis without herniation or impingement.  L1-L2: Ventral spondylotic spurring. No herniation or impingement. Negative facets.  L2-L3: Bulky ventral spondylotic spurring. No herniation. No impingement. Negative facets.  L3-L4: Spondylotic endplate spurring and mild annulus bulging. No disc herniation. Facet arthropathy, mild, with ligamentum flavum thickening.  L4-L5: Mild annulus bulging. Degenerative ligamentous overgrowth and facet spurring. No herniation or impingement.  L5-S1:Annulus bulging and spondylotic spurring. Most advanced level of facet arthropathy with advanced joint narrowing and bilateral facet spurring. Small left foraminal protrusion, chronic and without neural impingement.  IMPRESSION: Lower lumbar facet arthropathy with milder disc degeneration, described above and stable from 2012. No compressive stenosis.   She reports that she quit smoking about 58 years ago. She has never used smokeless tobacco. No results for input(s): HGBA1C, LABURIC in the last 8760 hours.  Objective:  VS:  HT:    WT:   BMI:     BP:   HR: bpm  TEMP: ( )  RESP:  Physical Exam  Musculoskeletal:  Inspection reveals some atrophy of the right APB but no atrophy of the left APB or bilateral  FDI or hand intrinsics. There is no swelling, color changes, allodynia or dystrophic changes. There is 5 out of 5 strength in the bilateral wrist extension, finger abduction and long finger flexion.  There is decreased sensation to light  touch in the right median nerve distribution. There is a negative Hoffmann's test bilaterally.  There is triggering of the left ring finger.    Ortho Exam Imaging: No results found.  Past Medical/Family/Surgical/Social History: Medications & Allergies reviewed per EMR, new medications updated. Patient Active Problem List   Diagnosis Date Noted  .  Fibromyalgia 01/21/2017  . Chronic pain syndrome 01/21/2017  . Spondylosis without myelopathy or radiculopathy, lumbar region 01/21/2017  . H/O total hip arthroplasty, right 01/21/2017  . Incomplete rotator cuff tear right shoulder 07/08/2013   Past Medical History:  Diagnosis Date  . Arthritis   . Coronary artery disease    STENT 7 YRS AGO - followed by Dr. Leeann Mustenaldo in [place]   . Fibromyalgia   . History of nonmelanoma skin cancer   . History of palpitations    takes Metoprolol - pt denies any Hx HBP  . RCT (rotator cuff tear)    RT SHOULDER  . Rupture of biceps tendon    RT   No family history on file. Past Surgical History:  Procedure Laterality Date  . BREAST SURGERY  "MANY YRS AGO"   CHRONIC MASTITIS SURG  . CARDIAC CATHETERIZATION    . CHOLECYSTECTOMY    . JOINT REPLACEMENT  2012   RT HIP  . SHOULDER ARTHROSCOPY WITH ROTATOR CUFF REPAIR Right 07/08/2013   Procedure: RIGHT SHOULDER ARTHROSCOPY WITH DEBRIDEMENT, Subacromial Decompression;  Surgeon: Kathryne Hitchhristopher Y Blackman, MD;  Location: [facility];  Service: Orthopedics;  Laterality: Right;  . STENTED CORONARY ARTERY  2008   Social History   Occupational History  . Not on file  Tobacco Use  . Smoking status: Former Smoker    Last attempt to quit: 07/05/1959    Years since quitting: 58.2  . Smokeless tobacco: Never Used  Substance and Sexual Activity  . Alcohol use: No  . Drug use: No  . Sexual activity: Not on file

## 2017-10-05 ENCOUNTER — Ambulatory Visit (INDEPENDENT_AMBULATORY_CARE_PROVIDER_SITE_OTHER): Payer: Medicare Other | Admitting: Physician Assistant

## 2017-10-05 ENCOUNTER — Encounter (INDEPENDENT_AMBULATORY_CARE_PROVIDER_SITE_OTHER): Payer: Self-pay | Admitting: Physician Assistant

## 2017-10-05 DIAGNOSIS — G5603 Carpal tunnel syndrome, bilateral upper limbs: Secondary | ICD-10-CM

## 2017-10-05 NOTE — Progress Notes (Cosign Needed)
Office Visit Note   Patient: Megan Wong           Date of Birth: [birth year]           MRN: [id number] Visit Date: 10/05/2017              Requested by: Megan Quill, PA-C [address] [place] [address] PCP: Megan Quill, PA-C   Assessment & Plan: Visit Diagnoses:  1. Carpal tunnel syndrome on both sides     Plan: Due to patient's continued symptoms and findings on EMG nerve conduction studies which are consistent with Right Severe Carpal Tunl. and Moderate Carpal Tunl. syndrome left recommend surgery.  We did start  with the right wrist.  Questions encouraged and answered.  Risk were reviewed with the patient including but not limited to infection, wound healing problems, nerve or vessel injury.  She would like to proceed with surgery in the near future.  See her back 2 weeks postop.  Questions encouraged and answered at length  Follow-Up Instructions: Return for 2 weeks post op.   Orders:  No orders of the defined types were placed in this encounter.  No orders of the defined types were placed in this encounter.     Procedures: No procedures performed   Clinical Data: No additional findings.   Subjective: Chief Complaint  Patient presents with  . Right Hand - Follow-up    EMG/NCS Reveiw  . Left Hand - Follow-up    EMG/NCS Reveiw    HPI  Megan Wong returns today to go over the EMG nerve conduction study results bilateral upper extremities.  She states she continues to have pain in both hands that awakens her.  Her right hand at this point time is worse.  She did undergo EMG nerve conduction studies by Dr. Alvester Morin and returns to take over the results.  EMG nerve conduction study showed severe right median nerve entrapment and moderate left median nerve entrapment at the wrist. Review of Systems See HPI otherwise negative  Objective: Vital Signs: There were no vitals taken for this visit.  Physical Exam  Constitutional: She is  oriented to person, place, and time. She appears well-developed and well-nourished. No distress.  Pulmonary/Chest: Effort normal.  Neurological: She is alert and oriented to person, place, and time.  Skin: She is not diaphoretic.  Psychiatric: She has a normal mood and affect.    Ortho Exam Bilateral hands decreased sensation median nerve distribution slightly worse on the right than the left subjectively.  Positive Tinel's bilaterally at the wrist.  No rashes skin lesions ulcerations bilateral hands. Specialty Comments:  No specialty comments available.  Imaging: No results found.   PMFS History: Patient Active Problem List   Diagnosis Date Noted  . Fibromyalgia 01/21/2017  . Chronic pain syndrome 01/21/2017  . Spondylosis without myelopathy or radiculopathy, lumbar region 01/21/2017  . H/O total hip arthroplasty, right 01/21/2017  . Incomplete rotator cuff tear right shoulder 07/08/2013   Past Medical History:  Diagnosis Date  . Arthritis   . Coronary artery disease    STENT 7 YRS AGO - followed by Dr. Leeann Must in [place]   . Fibromyalgia   . History of nonmelanoma skin cancer   . History of palpitations    takes Metoprolol - pt denies any Hx HBP  . RCT (rotator cuff tear)    RT SHOULDER  . Rupture of biceps tendon    RT    No  family history on file.  Past Surgical History:  Procedure Laterality Date  . BREAST SURGERY  "MANY YRS AGO"   CHRONIC MASTITIS SURG  . CARDIAC CATHETERIZATION    . CHOLECYSTECTOMY    . JOINT REPLACEMENT  2012   RT HIP  . SHOULDER ARTHROSCOPY WITH ROTATOR CUFF REPAIR Right 07/08/2013   Procedure: RIGHT SHOULDER ARTHROSCOPY WITH DEBRIDEMENT, Subacromial Decompression;  Surgeon: Kathryne Hitchhristopher Y Blackman, MD;  Location: [facility];  Service: Orthopedics;  Laterality: Right;  . STENTED CORONARY ARTERY  2008   Social History   Occupational History  . Not on file  Tobacco Use  . Smoking status: Former Smoker    Last attempt to quit: 07/05/1959     Years since quitting: 58.2  . Smokeless tobacco: Never Used  Substance and Sexual Activity  . Alcohol use: No  . Drug use: No  . Sexual activity: Not on file

## 2017-10-05 NOTE — Procedures (Signed)
EMG & NCV Findings: Evaluation of the left median motor nerve showed prolonged distal onset latency (5.2 ms) and decreased conduction velocity (Elbow-Wrist, 42 m/s).  The right median motor nerve showed prolonged distal onset latency (6.3 ms), reduced amplitude (2.5 mV), and decreased conduction velocity (Elbow-Wrist, 40 m/s).  The left median (across palm) sensory nerve showed prolonged distal peak latency (Wrist, 4.9 ms).  The right median (across palm) sensory nerve showed prolonged distal peak latency (Wrist, 5.9 ms) and prolonged distal peak latency (Palm, 2.4 ms).  All remaining nerves (as indicated in the following tables) were within normal limits.  Left vs. Right side comparison data for the median motor nerve indicates abnormal L-R latency difference (1.1 ms).  All remaining left vs. right side differences were within normal limits.    Needle evaluation of the right abductor pollicis brevis muscle showed slightly increased spontaneous activity.  All remaining muscles (as indicated in the following table) showed no evidence of electrical instability.    Impression: The above electrodiagnostic study is ABNORMAL and reveals evidence of:  1.  A severe right median nerve entrapment at the wrist (carpal tunnel syndrome) affecting sensory and motor components.   2. A moderate left median nerve entrapment at the wrist (carpal tunnel syndrome) affecting sensory and motor components.    There is no significant electrodiagnostic evidence of any other focal nerve entrapment, brachial plexopathy or cervical radiculopathy.   Recommendations: 1.  Follow-up with referring physician. 2.  Continue current management of symptoms. 3.  Continue use of resting splint at night-time and as needed during the day. 4.  Suggest surgical evaluation.  ___________________________ Naaman Plummer FAAPMR Board Certified, American Board of Physical Medicine and Rehabilitation    Nerve Conduction Studies Anti Sensory  Summary Table   Stim Site NR Peak (ms) Norm Peak (ms) P-T Amp (V) Norm P-T Amp Site1 Site2 Delta-P (ms) Dist (cm) Vel (m/s) Norm Vel (m/s)  Left Median Acr Palm Anti Sensory (2nd Digit)  34C  Wrist    *4.9 <3.6 11.4 >10 Wrist Palm 2.9 0.0    Palm    2.0 <2.0 19.9         Right Median Acr Palm Anti Sensory (2nd Digit)  32.8C  Wrist    *5.9 <3.6 12.2 >10 Wrist Palm 3.5 0.0    Palm    *2.4 <2.0 7.1         Left Radial Anti Sensory (Base 1st Digit)  34.2C  Wrist    2.3 <3.1 17.8  Wrist Base 1st Digit 2.3 0.0    Right Radial Anti Sensory (Base 1st Digit)  31.9C  Wrist    2.4 <3.1 30.7  Wrist Base 1st Digit 2.4 0.0    Left Ulnar Anti Sensory (5th Digit)  34.7C  Wrist    3.7 <3.7 19.1 >15.0 Wrist 5th Digit 3.7 14.0 38 >38  Right Ulnar Anti Sensory (5th Digit)  32.4C  Wrist    3.7 <3.7 18.8 >15.0 Wrist 5th Digit 3.7 14.0 38 >38   Motor Summary Table   Stim Site NR Onset (ms) Norm Onset (ms) O-P Amp (mV) Norm O-P Amp Site1 Site2 Delta-0 (ms) Dist (cm) Vel (m/s) Norm Vel (m/s)  Left Median Motor (Abd Poll Brev)  33.7C  Wrist    *5.2 <4.2 5.4 >5 Elbow Wrist 4.5 19.0 *42 >50  Elbow    9.7  5.9         Right Median Motor (Abd Poll Brev)  31.7C  Wrist    *  6.3 <4.2 *2.5 >5 Elbow Wrist 4.6 18.5 *40 >50  Elbow    10.9  2.6         Left Ulnar Motor (Abd Dig Min)  33.5C  Wrist    3.0 <4.2 11.1 >3 B Elbow Wrist 3.3 18.0 55 >53  B Elbow    6.3  8.9  A Elbow B Elbow 1.4 10.0 71 >53  A Elbow    7.7  8.5         Right Ulnar Motor (Abd Dig Min)  32.3C  Wrist    2.9 <4.2 9.3 >3 B Elbow Wrist 3.4 19.0 56 >53  B Elbow    6.3  8.9  A Elbow B Elbow 1.2 10.0 83 >53  A Elbow    7.5  7.7          EMG   Side Muscle Nerve Root Ins Act Fibs Psw Amp Dur Poly Recrt Int Dennie BiblePat Comment  Right Abd Poll Brev Median C8-T1 Nml *1+ *1+ Nml Nml 0 Nml Nml   Right 1stDorInt Ulnar C8-T1 Nml Nml Nml Nml Nml 0 Nml Nml   Right PronatorTeres Median C6-7 Nml Nml Nml Nml Nml 0 Nml Nml   Right Biceps Musculocut C5-6 Nml  Nml Nml Nml Nml 0 Nml Nml   Right Deltoid Axillary C5-6 Nml Nml Nml Nml Nml 0 Nml Nml     Nerve Conduction Studies Anti Sensory Left/Right Comparison   Stim Site L Lat (ms) R Lat (ms) L-R Lat (ms) L Amp (V) R Amp (V) L-R Amp (%) Site1 Site2 L Vel (m/s) R Vel (m/s) L-R Vel (m/s)  Median Acr Palm Anti Sensory (2nd Digit)  34C  Wrist *4.9 *5.9 1.0 11.4 12.2 6.6 Wrist Palm     Palm 2.0 *2.4 0.4 19.9 7.1 64.3       Radial Anti Sensory (Base 1st Digit)  34.2C  Wrist 2.3 2.4 0.1 17.8 30.7 42.0 Wrist Base 1st Digit     Ulnar Anti Sensory (5th Digit)  34.7C  Wrist 3.7 3.7 0.0 19.1 18.8 1.6 Wrist 5th Digit 38 38 0   Motor Left/Right Comparison   Stim Site L Lat (ms) R Lat (ms) L-R Lat (ms) L Amp (mV) R Amp (mV) L-R Amp (%) Site1 Site2 L Vel (m/s) R Vel (m/s) L-R Vel (m/s)  Median Motor (Abd Poll Brev)  33.7C  Wrist *5.2 *6.3 *1.1 5.4 *2.5 53.7 Elbow Wrist *42 *40 2  Elbow 9.7 10.9 1.2 5.9 2.6 55.9       Ulnar Motor (Abd Dig Min)  33.5C  Wrist 3.0 2.9 0.1 11.1 9.3 16.2 B Elbow Wrist 55 56 1  B Elbow 6.3 6.3 0.0 8.9 8.9 0.0 A Elbow B Elbow 71 83 12  A Elbow 7.7 7.5 0.2 8.5 7.7 9.4          Waveforms:

## 2017-10-12 ENCOUNTER — Telehealth (INDEPENDENT_AMBULATORY_CARE_PROVIDER_SITE_OTHER): Payer: Self-pay | Admitting: Orthopaedic Surgery

## 2017-10-12 NOTE — Telephone Encounter (Signed)
She does not need to stop her aspirin.

## 2017-10-12 NOTE — Telephone Encounter (Signed)
She doesn't need to stop ASA for carpal tunnel does she?

## 2017-10-12 NOTE — Telephone Encounter (Signed)
Patient aware she doesn't have to stop this

## 2017-10-12 NOTE — Telephone Encounter (Signed)
Patient called stating that she is having surgery on Thursday and wanted to know if she needs to stop taking her 325mg  Aspirin.  CB#[id number] or [phone].

## 2017-10-15 DIAGNOSIS — G5601 Carpal tunnel syndrome, right upper limb: Secondary | ICD-10-CM | POA: Diagnosis not present

## 2017-10-29 ENCOUNTER — Encounter (INDEPENDENT_AMBULATORY_CARE_PROVIDER_SITE_OTHER): Payer: Self-pay | Admitting: Physician Assistant

## 2017-10-29 ENCOUNTER — Ambulatory Visit (INDEPENDENT_AMBULATORY_CARE_PROVIDER_SITE_OTHER): Payer: Medicare Other | Admitting: Physician Assistant

## 2017-10-29 DIAGNOSIS — Z9889 Other specified postprocedural states: Secondary | ICD-10-CM

## 2017-10-29 NOTE — Progress Notes (Signed)
[name] returns today 2 weeks status post carpal tunnel release right hand.  She states she is had no fevers chills shortness of breath chest pain.  She notes that she only has numbness in the tip of the thumb now.  Is much improved to what she had prior to surgery.  7 no waking pain.  Physical exam: Right hand surgical incisions well approximated with interrupted nylon sutures.  There is no signs of overall infection slight erythema.  There is some ecchymosis in the hand over the volar aspect of the metacarpophalangeal joints to include this long finger and ring finger proximally.  Impression: 2-week status post right carpal tunnel release  Plan: She will work on range of motion of the hand.  Scar tissue mobilization.  Sutures were removed Steri-Strips applied.  She will wash the hand daily with antibacterial soap.  No submerging.  No heavy lifting with the hand.  Follow-up in 4 weeks sooner if there is any questions or concerns.  She is thinking about left carpal tunnel release sometime in November.

## 2017-11-26 ENCOUNTER — Encounter (INDEPENDENT_AMBULATORY_CARE_PROVIDER_SITE_OTHER): Payer: Self-pay | Admitting: Physician Assistant

## 2017-11-26 ENCOUNTER — Ambulatory Visit (INDEPENDENT_AMBULATORY_CARE_PROVIDER_SITE_OTHER): Payer: Medicare Other | Admitting: Physician Assistant

## 2017-11-26 DIAGNOSIS — G5602 Carpal tunnel syndrome, left upper limb: Secondary | ICD-10-CM

## 2017-11-26 NOTE — Progress Notes (Signed)
Office Visit Note   Patient: Megan Wong           Date of Birth: [birth year]           MRN: [id number] Visit Date: 11/26/2017              Requested by: Harvie Heck, MD [address] [place], [place] [address] PCP: Harvie Heck, MD   Assessment & Plan: Visit Diagnoses:  1. Carpal tunnel syndrome, left upper limb     Plan: She will continue to work on scar tissue mobilization of the right wrist.  We will see her back 2 weeks status post left carpal tunnel release.  Questions encouraged and answered.  She understands risk benefits of carpal tunnel release as she is to 6 weeks status post right carpal tunnel release.  Follow-Up Instructions: Return for 2 weeks post-op.   Orders:  No orders of the defined types were placed in this encounter.  No orders of the defined types were placed in this encounter.     Procedures: No procedures performed   Clinical Data: No additional findings.   Subjective: Chief Complaint  Patient presents with  . Right Wrist - Routine Post Op    10/15/17 right carpal tunnel release    HPI [name] returns 6 weeks status post right carpal tunnel release.  She states overall she is doing well.  She does have some numbness of her right thumb at times but otherwise no longer having the numbness tingling she is having and hand prior to surgery.  She is having with the results.  She would like to proceed with left carpal tunnel release sometime early November.  She had moderate carpal tunnel syndrome on the left. Review of Systems See HPI otherwise negative  Objective: Vital Signs: There were no vitals taken for this visit.  Physical Exam General: Well-developed well-nourished female no acute distress mood affect appropriate Ortho Exam Right hand full sensation except for the tip of the thumb.  Has full motor of the hand.  Surgical incisions healing well without any signs of infection. Specialty Comments:  No specialty comments  available.  Imaging: No results found.   PMFS History: Patient Active Problem List   Diagnosis Date Noted  . Carpal tunnel syndrome, left upper limb 11/26/2017  . Fibromyalgia 01/21/2017  . Chronic pain syndrome 01/21/2017  . Spondylosis without myelopathy or radiculopathy, lumbar region 01/21/2017  . H/O total hip arthroplasty, right 01/21/2017  . Incomplete rotator cuff tear right shoulder 07/08/2013   Past Medical History:  Diagnosis Date  . Arthritis   . Coronary artery disease    STENT 7 YRS AGO - followed by Dr. Leeann Must in [place]   . Fibromyalgia   . History of nonmelanoma skin cancer   . History of palpitations    takes Metoprolol - pt denies any Hx HBP  . RCT (rotator cuff tear)    RT SHOULDER  . Rupture of biceps tendon    RT    No family history on file.  Past Surgical History:  Procedure Laterality Date  . BREAST SURGERY  "MANY YRS AGO"   CHRONIC MASTITIS SURG  . CARDIAC CATHETERIZATION    . CHOLECYSTECTOMY    . JOINT REPLACEMENT  2012   RT HIP  . SHOULDER ARTHROSCOPY WITH ROTATOR CUFF REPAIR Right 07/08/2013   Procedure: RIGHT SHOULDER ARTHROSCOPY WITH DEBRIDEMENT, Subacromial Decompression;  Surgeon: Kathryne Hitch, MD;  Location: [facility];  Service: Orthopedics;  Laterality: Right;  .  STENTED CORONARY ARTERY  2008   Social History   Occupational History  . Not on file  Tobacco Use  . Smoking status: Former Smoker    Last attempt to quit: 07/05/1959    Years since quitting: 58.4  . Smokeless tobacco: Never Used  Substance and Sexual Activity  . Alcohol use: No  . Drug use: No  . Sexual activity: Not on file

## 2018-01-28 DIAGNOSIS — G5602 Carpal tunnel syndrome, left upper limb: Secondary | ICD-10-CM | POA: Diagnosis not present

## 2018-02-11 ENCOUNTER — Ambulatory Visit (INDEPENDENT_AMBULATORY_CARE_PROVIDER_SITE_OTHER): Payer: Medicare Other | Admitting: Orthopaedic Surgery

## 2018-02-11 ENCOUNTER — Encounter (INDEPENDENT_AMBULATORY_CARE_PROVIDER_SITE_OTHER): Payer: Self-pay | Admitting: Orthopaedic Surgery

## 2018-02-11 DIAGNOSIS — G5602 Carpal tunnel syndrome, left upper limb: Secondary | ICD-10-CM

## 2018-02-11 NOTE — Progress Notes (Signed)
The patient is 2 weeks status post a left open carpal tunnel release and 17 weeks status post a right carpal tunnel release.  She is 82 years old and says she is doing great overall.  She says both hands are doing well.  On examination of her left operative side remove the sutures in place Steri-Strips.  She is actually having pain at the A1 pulley area of her middle finger on that side and she is having problems making a fist as result of it.  I do not feel any active triggering but is definitely painful to her that did place a steroid injection in this area.  Her right hand is doing great.  Both hands are well-perfused.  She will continue increase her activities as comfort allows.  She will use her hands as she is comfortable.  We will see her back in 4 weeks to see how she is doing overall.

## 2018-03-08 ENCOUNTER — Ambulatory Visit (INDEPENDENT_AMBULATORY_CARE_PROVIDER_SITE_OTHER): Payer: Medicare Other | Admitting: Orthopaedic Surgery

## 2018-03-08 ENCOUNTER — Encounter (INDEPENDENT_AMBULATORY_CARE_PROVIDER_SITE_OTHER): Payer: Self-pay | Admitting: Orthopaedic Surgery

## 2018-03-08 DIAGNOSIS — Z9889 Other specified postprocedural states: Secondary | ICD-10-CM

## 2018-03-08 DIAGNOSIS — G5602 Carpal tunnel syndrome, left upper limb: Secondary | ICD-10-CM

## 2018-03-08 NOTE — Progress Notes (Signed)
[name] is recovering status post bilateral carpal tunnel releases.  Her right was done in July and left and was done just a few months ago.  She said she is doing wonderful.  We did recently inject her left hand middle finger due to trigger finger at the A1 pulley and she said that resolved completely.  On examination both incisions look great.  She has good pinch and grip strength on both hands.  The triggering is resolved on the left middle finger.  At this point she is doing well making good progress.  She knows it can take 6 months to a year or more to get complete resolution of her symptoms and if the carpal tunnel syndrome is so severe in her age.  She may never get full recovery.  Overall though she is incredibly satisfied.  All question concerns were answered and addressed.  Follow-up will be as needed.

## 2018-03-11 ENCOUNTER — Ambulatory Visit (INDEPENDENT_AMBULATORY_CARE_PROVIDER_SITE_OTHER): Payer: Medicare Other | Admitting: Orthopaedic Surgery

## 2018-09-04 IMAGING — MR MR LUMBAR SPINE W/O CM
4 of 5 series · 25 of 48 positions shown · non-contrast
Comparison: Lumbar MRI 02/20/2015.

CLINICAL DATA: 80-year-old female with lumbar back pain radiating
to both legs for 3 weeks. Bilateral leg weakness. Prior spinal
steroid injections.

EXAM:
MRI LUMBAR SPINE WITHOUT CONTRAST
TECHNIQUE: Multiplanar, multisequence MR imaging of the lumbar spine was
performed. No intravenous contrast was administered.

[Series 3: T2 · sagittal · 4.0mm · 0.55mm/px · 6 of 14 slices shown (1 of 2)]
[im 1/14]
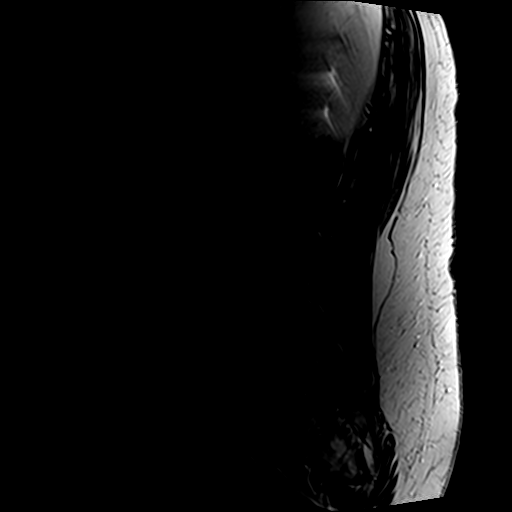
[im 3/14]
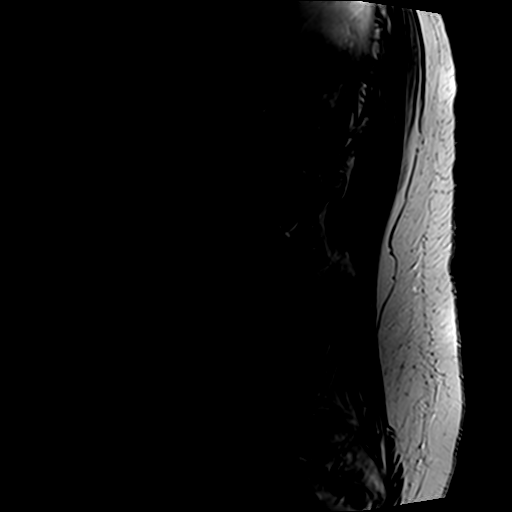
[im 6/14]
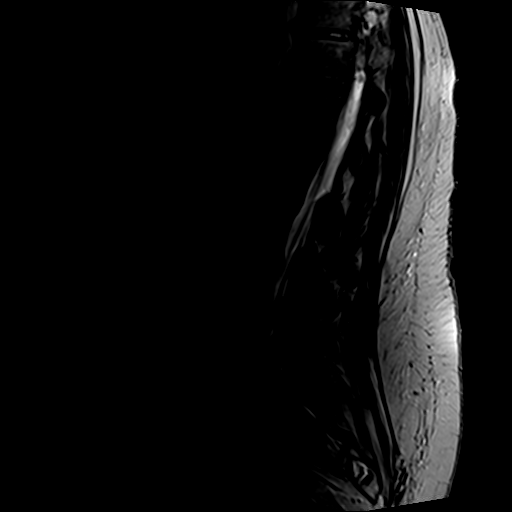
[im 8/14]
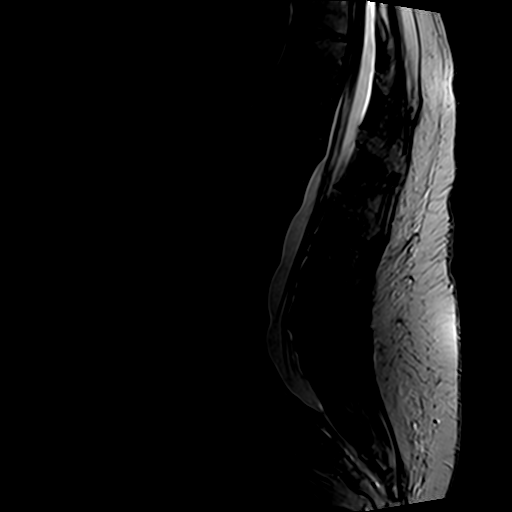
[im 11/14]
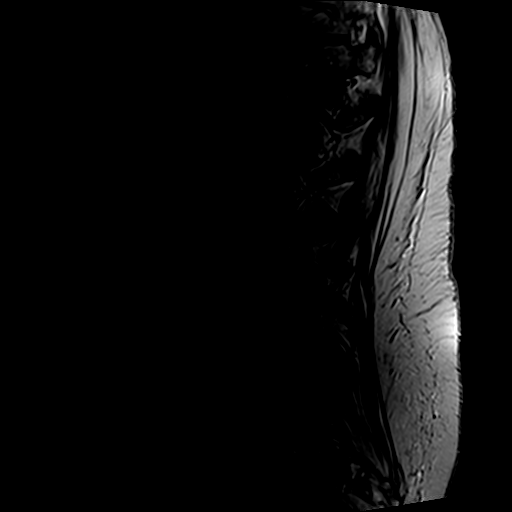
[im 14/14]
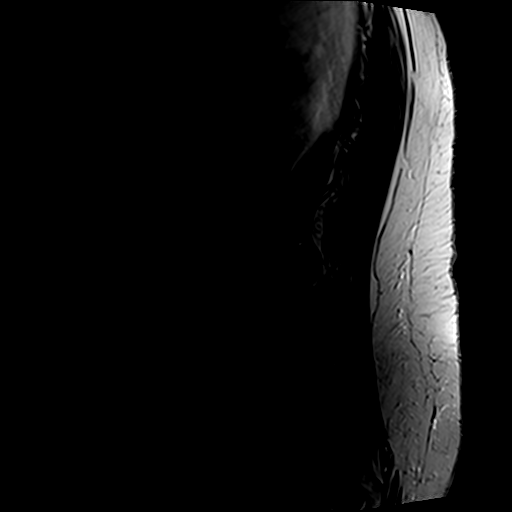

[Series 5: T1 · sagittal · 4.0mm · 0.55mm/px · 6 of 14 slices shown (1 of 2)]
[im 1/14]
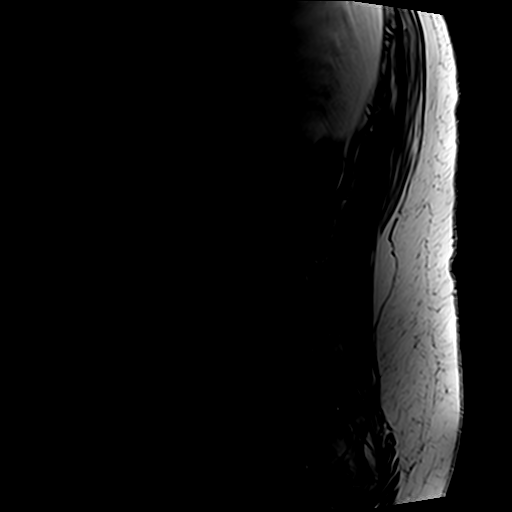
[im 3/14]
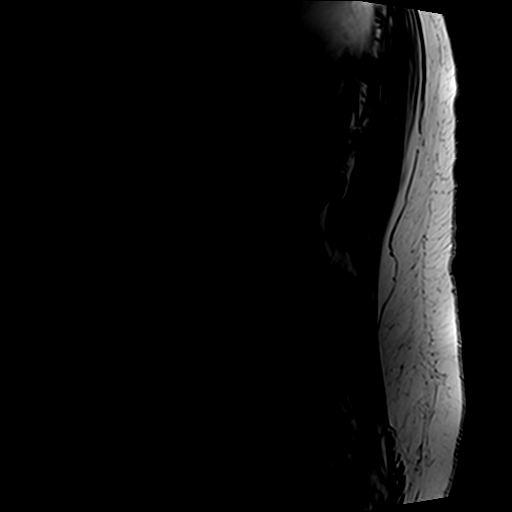
[im 6/14]
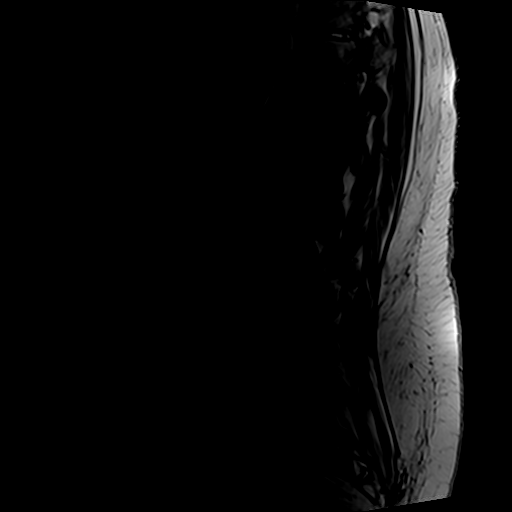
[im 8/14]
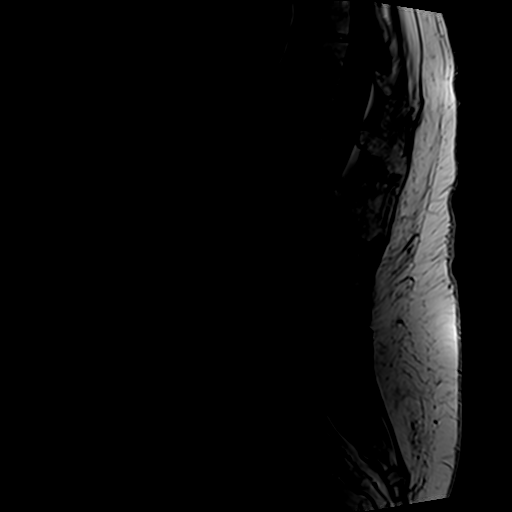
[im 11/14]
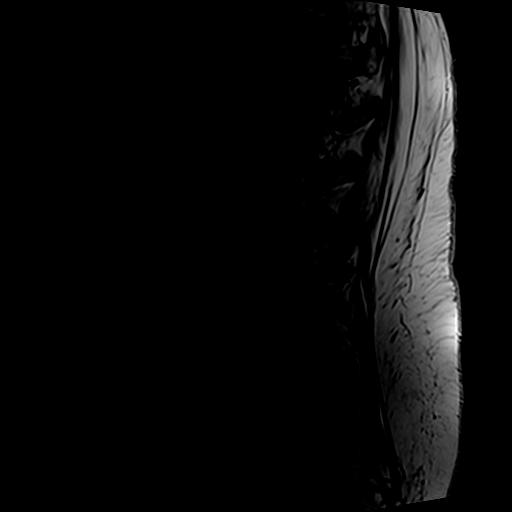
[im 14/14]
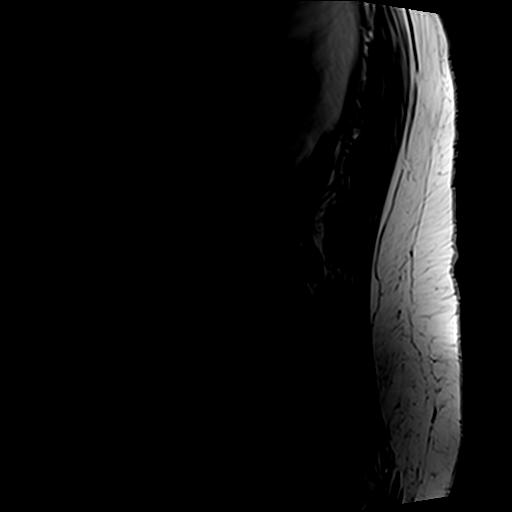

[Series 6: T2 · axial · 4.0mm · 0.70mm/px · z∈[-34,+150]mm · 9 of 36 slices shown (2 of 2)]
[im 1/36]
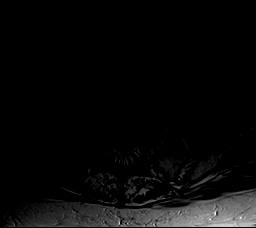
[im 6/36]
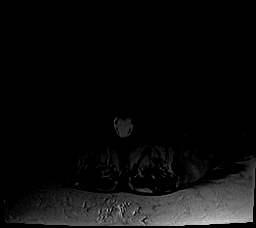
[im 11/36]
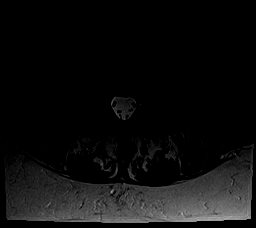
[im 16/36]
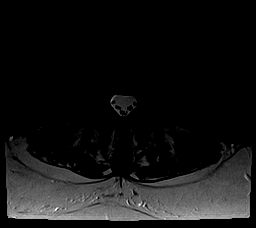
[im 18/36]
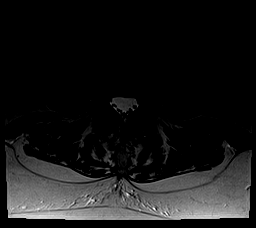
[im 21/36]
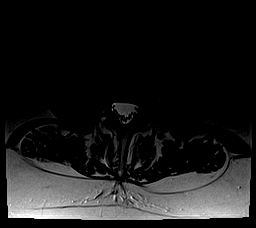
[im 26/36]
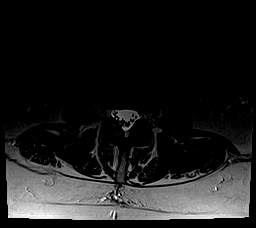
[im 31/36]
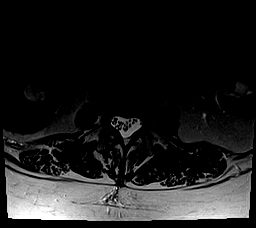
[im 36/36]
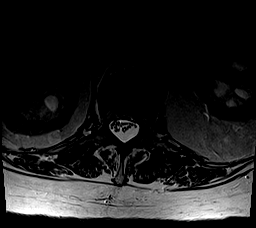

[Series 7: T1 · axial · 4.0mm · 0.35mm/px · z∈[-34,+125]mm · 4 of 36 slices shown (2 of 2)]
[im 1/36]
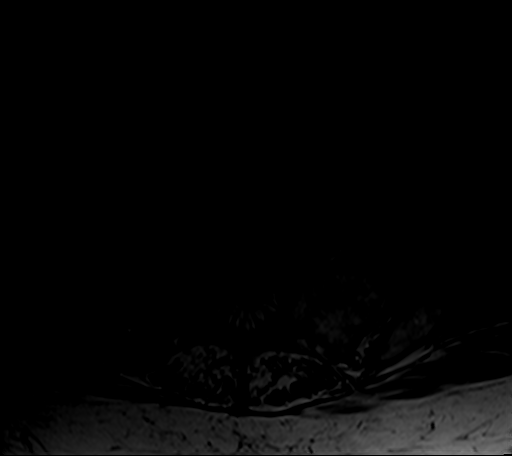
[im 6/36]
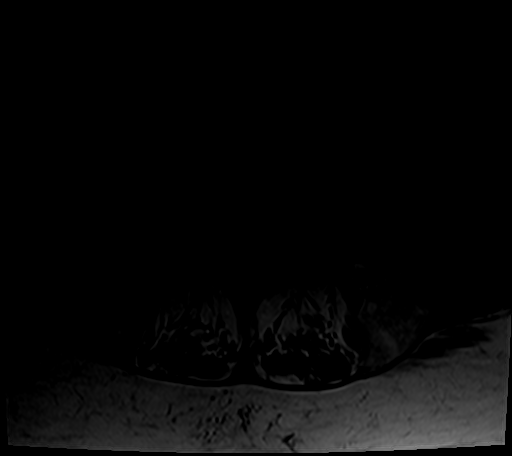
[im 18/36]
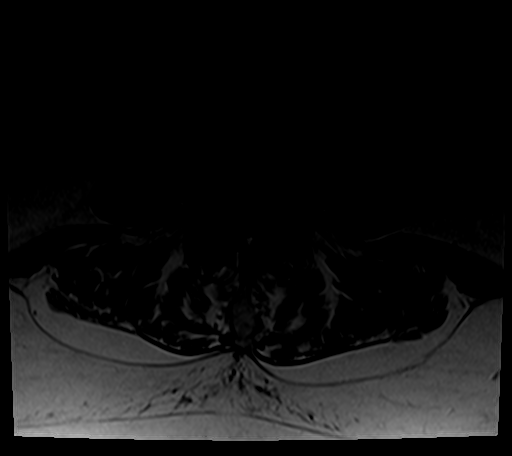
[im 31/36]
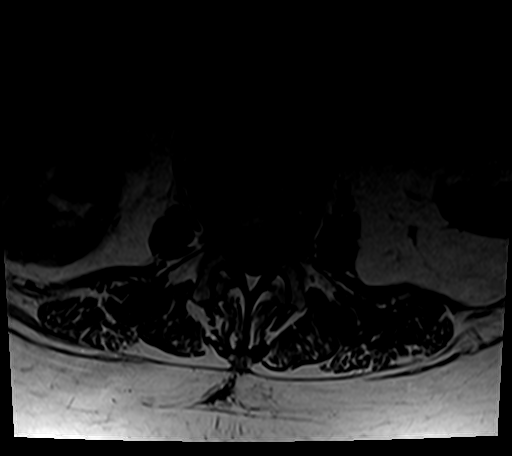

[25 of 48 positions shown; findings below may reference images not displayed]

FINDINGS: Segmentation: Lumbar segmentation appears to be normal and is the
same numbering system used in 2136.

Alignment:  Stable since 2136.  Preserved lumbar lordosis.

Vertebrae: Visualized bone marrow signal is within normal limits. No
marrow edema or evidence of acute osseous abnormality. Intact
visible sacrum and SI joints.

Conus medullaris: Extends to the T12 level and appears normal.

Paraspinal and other soft tissues: Stable, including chronic small
renal parapelvic cysts.

Disc levels:

T11-T12: Chronic interbody and probable posterior element ankylosis.
Furthermore there might be chronic postoperative changes to the left
lamina. This level appears stable, with no definite stenosis.

T12-L1:  Negative.

L1-L2: Mild disc bulge with broad-based posterior component. No
stenosis.

L2-L3:  Mild disc bulge and facet hypertrophy.  No stenosis.

L3-L4: Mild disc bulge and mild to moderate facet hypertrophy. Mild
endplate spurring. Left greater than right broad-based foraminal
component of disc. Mild left L3 foraminal stenosis. This level is
stable.

L4-L5: Mild circumferential disc bulge. Mild to moderate facet and
ligament flavum hypertrophy. Borderline to Mild spinal and bilateral
L4 foraminal stenosis. This level is stable.

L5-S1: Mostly far lateral disc bulging and endplate spurring.
Moderate facet hypertrophy. Mild left L5 foraminal stenosis. This
level is stable.
IMPRESSION: 1. Stable MRI appearance of the lumbar spine since 2136. No acute
osseous abnormality.
2. Intermittent borderline to mild multifactorial lower lumbar
stenosis. No new or progressive neural impingement.
3. Chronic ankylosis in the visible lower thoracic spine, possibly
with superimposed chronic postoperative changes to the left
posterior elements.

## 2018-12-15 ENCOUNTER — Telehealth: Payer: Self-pay | Admitting: *Deleted

## 2018-12-15 NOTE — Telephone Encounter (Signed)
Pt is scheduled 01/05/2019 for OV with possible bursa injecitons

## 2018-12-15 NOTE — Telephone Encounter (Signed)
Actually had bursa injeciton 12/2016 after new MRI so do OV with possible bursa injecitons

## 2018-12-15 NOTE — Telephone Encounter (Signed)
Called pt and unable to lvm. Will call again later.

## 2019-01-05 ENCOUNTER — Telehealth: Payer: Self-pay | Admitting: *Deleted

## 2019-01-05 ENCOUNTER — Ambulatory Visit (INDEPENDENT_AMBULATORY_CARE_PROVIDER_SITE_OTHER): Payer: Medicare Other | Admitting: Physical Medicine and Rehabilitation

## 2019-01-05 ENCOUNTER — Ambulatory Visit: Payer: Self-pay

## 2019-01-05 ENCOUNTER — Other Ambulatory Visit: Payer: Self-pay

## 2019-01-05 ENCOUNTER — Encounter: Payer: Self-pay | Admitting: Physical Medicine and Rehabilitation

## 2019-01-05 VITALS — BP 131/62 | HR 56 | Ht 63.0 in | Wt 173.0 lb

## 2019-01-05 DIAGNOSIS — M47816 Spondylosis without myelopathy or radiculopathy, lumbar region: Secondary | ICD-10-CM | POA: Diagnosis not present

## 2019-01-05 DIAGNOSIS — M797 Fibromyalgia: Secondary | ICD-10-CM

## 2019-01-05 DIAGNOSIS — M7062 Trochanteric bursitis, left hip: Secondary | ICD-10-CM | POA: Diagnosis not present

## 2019-01-05 DIAGNOSIS — G894 Chronic pain syndrome: Secondary | ICD-10-CM

## 2019-01-05 NOTE — Telephone Encounter (Signed)
[id number] Injection(s), diagnostic or therapeutic more  Notification/Prior Authorization not required if procedure performed in Office; otherwise may be required for this service. 

## 2019-01-05 NOTE — Progress Notes (Signed)
Megan Hertzdna F Dilday - 83 y.o. female MRN [id number]  Date of birth: [birth year]  Office Visit Note: Visit Date: 01/05/2019 PCP: Harvie HeckJackson, Mishi, MD Referred by: Harvie HeckJackson, Mishi, MD  Subjective: Chief Complaint  Patient presents with  . Lower Back - Pain  . Left Thigh - Pain   HPI: Megan Wong is a 83 y.o. female who comes in today For evaluation management of low back and left hip and leg pain.  She is typically followed by Dr. Doneen Poissonhristopher Blackman in our office.  In the past we have completed epidural injection with good relief.  Last time I saw her for her back however was in 2018 and we did complete bursa injection which was beneficial at the time.  MRI findings of her spine have been fairly mild spondylosis and facet arthropathy without high-grade stenosis.  Last MRI was in 2016.  Since I have last seen her which was really for her hands and we did electrodiagnostic study, she has had carpal tunnel release and doing well from that.  She reports about 5 or 6 weeks ago she began having left side low back pain and buttocks pain with some referral to the thigh.  She reports bending over and getting in and out of the bathtub gives her a great deal of pain.  She has been using some Flexeril with some relief.  Pain is intermittent stabbing and aching pain.  No right-sided complaints.  Nothing really past the knee.  No paresthesias.  She does get pain laying on the side and that is problematic as well.  She has had no new trauma or falls.  No fevers chills night sweats.  She did have a bout of pneumonia in June that was felt to be possibly related to coronavirus.  Review of Systems  Constitutional: Negative for chills, fever, malaise/fatigue and weight loss.  HENT: Negative for hearing loss and sinus pain.   Eyes: Negative for blurred vision, double vision and photophobia.  Respiratory: Negative for cough and shortness of breath.   Cardiovascular: Negative for chest pain, palpitations and leg swelling.   Gastrointestinal: Negative for abdominal pain, nausea and vomiting.  Genitourinary: Negative for flank pain.  Musculoskeletal: Positive for back pain and joint pain. Negative for myalgias.  Skin: Negative for itching and rash.  Neurological: Negative for tremors, focal weakness and weakness.  Endo/Heme/Allergies: Negative.   Psychiatric/Behavioral: Negative for depression.  All other systems reviewed and are negative.  Otherwise per HPI.  Assessment & Plan: Visit Diagnoses:  1. Greater trochanteric bursitis, left   2. Spondylosis without myelopathy or radiculopathy, lumbar region   3. Chronic pain syndrome   4. Fibromyalgia     Plan: Findings:  Chronic worsening severe left low back hip and thigh pain more consistent with greater trochanteric bursitis versus radicular pain.  She has no pain with hip rotation no groin pain.  We did decide to complete diagnostic fluoroscopically guided greater trochanteric injection today which is helped her in the past and will see if it helps today.  If it does not help I would look at one-time epidural injection which would be an L5 or L4 transforaminal injection.  If those are not helping at that point clearly would look at updating MRI at that point if the symptoms are still severe enough.  She will continue to follow with Dr. Doneen Poissonhristopher Blackman for her orthopedic care.    Meds & Orders: No orders of the defined types were placed in this encounter.  Orders Placed This Encounter  Procedures  . XR C-ARM NO REPORT    Follow-up: No follow-ups on file.   Procedures: Large Joint Inj: L greater trochanter on 01/05/2019 10:54 AM Indications: pain and diagnostic evaluation Details: 22 G 3.5 in needle, fluoroscopy-guided lateral approach  Arthrogram: No  Medications: 4 mL lidocaine 2 %; 4 mL bupivacaine 0.25 %; 60 mg triamcinolone acetonide 40 MG/ML Outcome: tolerated well, no immediate complications  There was excellent flow of contrast outlined  the greater trochanteric bursa without vascular uptake. Procedure, treatment alternatives, risks and benefits explained, specific risks discussed. Consent was given by the patient. Immediately prior to procedure a time out was called to verify the correct patient, procedure, equipment, support staff and site/side marked as required. Patient was prepped and draped in the usual sterile fashion.      No notes on file   Clinical History: MRI LUMBAR SPINE WITHOUT CONTRAST 02/20/2015  TECHNIQUE: Multiplanar, multisequence MR imaging of the lumbar spine was performed. No intravenous contrast was administered.  COMPARISON: 12/31/2010  FINDINGS: No marrow signal abnormality suggestive of fracture, infection, or neoplasm. Normal conus signal and morphology. No perispinal abnormality to explain back pain. Bilateral renal sinus cysts are noted.  Degenerative changes:  T12- L1: Spondylosis without herniation or impingement.  L1-L2: Ventral spondylotic spurring. No herniation or impingement. Negative facets.  L2-L3: Bulky ventral spondylotic spurring. No herniation. No impingement. Negative facets.  L3-L4: Spondylotic endplate spurring and mild annulus bulging. No disc herniation. Facet arthropathy, mild, with ligamentum flavum thickening.  L4-L5: Mild annulus bulging. Degenerative ligamentous overgrowth and facet spurring. No herniation or impingement.  L5-S1:Annulus bulging and spondylotic spurring. Most advanced level of facet arthropathy with advanced joint narrowing and bilateral facet spurring. Small left foraminal protrusion, chronic and without neural impingement.  IMPRESSION: Lower lumbar facet arthropathy with milder disc degeneration, described above and stable from 2012. No compressive stenosis.   She reports that she quit smoking about 59 years ago. She has never used smokeless tobacco. No results for input(s): HGBA1C, LABURIC in the last 8760 hours.   Objective:  VS:  HT:5\' 3"  (160 cm)   WT:173 lb (78.5 kg)  BMI:30.65    BP:131/62  HR:(!) 56bpm  TEMP: ( )  RESP:  Physical Exam Vitals signs and nursing note reviewed.  Constitutional:      General: She is not in acute distress.    Appearance: Normal appearance. She is well-developed. She is not ill-appearing.  HENT:     Head: Normocephalic and atraumatic.  Eyes:     Conjunctiva/sclera: Conjunctivae normal.     Pupils: Pupils are equal, round, and reactive to light.  Cardiovascular:     Rate and Rhythm: Normal rate.     Pulses: Normal pulses.  Pulmonary:     Effort: Pulmonary effort is normal.  Musculoskeletal:        General: No tenderness or deformity.     Right lower leg: No edema.     Left lower leg: No edema.     Comments: Patient ambulates without aid she is somewhat slow to rise from a seated position.  She does have pain across the lower back with extension and facet loading.  She has pretty significant pain over the left greater trochanter that does seem to fit with her current pain.  No pain with internal rotation some pain with external rotation left more than right..  She has good distal strength.  Negative slump test.  Skin:    General: Skin is warm  and dry.     Findings: No erythema or rash.  Neurological:     General: No focal deficit present.     Mental Status: She is alert and oriented to person, place, and time.     Sensory: No sensory deficit.     Motor: No abnormal muscle tone.     Coordination: Coordination normal.     Gait: Gait normal.  Psychiatric:        Mood and Affect: Mood normal.        Behavior: Behavior normal.     Ortho Exam Imaging: No results found.  Past Medical/Family/Surgical/Social History: Medications & Allergies reviewed per EMR, new medications updated. Patient Active Problem List   Diagnosis Date Noted  . Carpal tunnel syndrome, left upper limb 11/26/2017  . Fibromyalgia 01/21/2017  . Chronic pain syndrome 01/21/2017  .  Spondylosis without myelopathy or radiculopathy, lumbar region 01/21/2017  . H/O total hip arthroplasty, right 01/21/2017  . Incomplete rotator cuff tear right shoulder 07/08/2013   Past Medical History:  Diagnosis Date  . Arthritis   . Coronary artery disease    STENT 7 YRS AGO - followed by Dr. Leeann Must in [place]   . Fibromyalgia   . History of nonmelanoma skin cancer   . History of palpitations    takes Metoprolol - pt denies any Hx HBP  . RCT (rotator cuff tear)    RT SHOULDER  . Rupture of biceps tendon    RT   History reviewed. No pertinent family history. Past Surgical History:  Procedure Laterality Date  . BREAST SURGERY  "MANY YRS AGO"   CHRONIC MASTITIS SURG  . CARDIAC CATHETERIZATION    . CHOLECYSTECTOMY    . JOINT REPLACEMENT  2012   RT HIP  . SHOULDER ARTHROSCOPY WITH ROTATOR CUFF REPAIR Right 07/08/2013   Procedure: RIGHT SHOULDER ARTHROSCOPY WITH DEBRIDEMENT, Subacromial Decompression;  Surgeon: Kathryne Hitch, MD;  Location: [facility];  Service: Orthopedics;  Laterality: Right;  . STENTED CORONARY ARTERY  2008   Social History   Occupational History  . Not on file  Tobacco Use  . Smoking status: Former Smoker    Quit date: 07/05/1959    Years since quitting: 59.5  . Smokeless tobacco: Never Used  Substance and Sexual Activity  . Alcohol use: No  . Drug use: No  . Sexual activity: Not on file

## 2019-01-05 NOTE — Progress Notes (Signed)
 .  Numeric Pain Rating Scale and Functional Assessment Average Pain 6 Pain Right Now 4 My pain is intermittent, stabbing and aching Pain is worse with: bending Pain improves with: medication   In the last MONTH (on 0-10 scale) has pain interfered with the following?  1. General activity like being  able to carry out your everyday physical activities such as walking, climbing stairs, carrying groceries, or moving a chair?  Rating(7)  2. Relation with others like being able to carry out your usual social activities and roles such as  activities at home, at work and in your community. Rating(7)  3. Enjoyment of life such that you have  been bothered by emotional problems such as feeling anxious, depressed or irritable?  Rating(3)

## 2019-01-21 ENCOUNTER — Telehealth: Payer: Self-pay | Admitting: *Deleted

## 2019-01-21 NOTE — Telephone Encounter (Signed)
Pt lvm stating that she would like to cancel her appt 01/26/2019 and will call back another time to r/s.

## 2019-01-26 ENCOUNTER — Encounter: Payer: Medicare Other | Admitting: Physical Medicine and Rehabilitation

## 2019-02-08 ENCOUNTER — Encounter: Payer: Self-pay | Admitting: Physical Medicine and Rehabilitation

## 2019-02-08 MED ORDER — TRIAMCINOLONE ACETONIDE 40 MG/ML IJ SUSP
60.0000 mg | INTRAMUSCULAR | Status: AC | PRN
  Administered 2019-01-05: 60 mg via INTRA_ARTICULAR

## 2019-02-08 MED ORDER — BUPIVACAINE HCL 0.25 % IJ SOLN
4.0000 mL | INTRAMUSCULAR | Status: AC | PRN
  Administered 2019-01-05: 4 mL via INTRA_ARTICULAR

## 2019-02-08 MED ORDER — LIDOCAINE HCL 2 % IJ SOLN
4.0000 mL | INTRAMUSCULAR | Status: AC | PRN
  Administered 2019-01-05: 4 mL

## 2020-02-20 ENCOUNTER — Telehealth: Payer: Self-pay | Admitting: Physical Medicine and Rehabilitation

## 2020-02-20 NOTE — Telephone Encounter (Signed)
Patient called requesting a call back to set appt to see Dr. Alvester Morin. Please call patient at [phone].

## 2020-02-20 NOTE — Telephone Encounter (Signed)
Yes, see if lateral hip pain, if more back pain let me know

## 2020-02-20 NOTE — Telephone Encounter (Signed)
Patient's last injection was a left greater trochanteric bursa injection in 2020. She was scheduled for bilateral L4-5 facets after that, but she cancelled that appointment. Ok to repeat bursa if same problem vs OV to discuss with possible injection?

## 2020-02-20 NOTE — Telephone Encounter (Signed)
Lateral hip/ leg pain. Scheduled for 1/10.

## 2020-04-02 ENCOUNTER — Encounter: Payer: Self-pay | Admitting: Physical Medicine and Rehabilitation

## 2020-04-02 ENCOUNTER — Ambulatory Visit: Payer: Self-pay

## 2020-04-02 ENCOUNTER — Ambulatory Visit: Payer: Medicare Other | Admitting: Physical Medicine and Rehabilitation

## 2020-04-02 ENCOUNTER — Other Ambulatory Visit: Payer: Self-pay

## 2020-04-02 DIAGNOSIS — M7062 Trochanteric bursitis, left hip: Secondary | ICD-10-CM

## 2020-04-02 NOTE — Patient Instructions (Signed)

## 2020-04-02 NOTE — Progress Notes (Signed)
Pt state left hip pain that travels down to her left knee. Pt state climbing stairs makes the pain worse. Pt takes over the counter meds to help ease the pain.   Numeric Pain Rating Scale and Functional Assessment Average Pain 6   In the last MONTH (on 0-10 scale) has pain interfered with the following?  1. General activity like being  able to carry out your everyday physical activities such as walking, climbing stairs, carrying groceries, or moving a chair?  Rating(8)

## 2020-04-02 NOTE — Progress Notes (Signed)
Megan Wong - 85 y.o. female MRN [id number]  Date of birth: 1936-01-28  Office Visit Note: Visit Date: 04/02/2020 PCP: Harvie Heck, MD Referred by: Harvie Heck, MD  Subjective: Chief Complaint  Patient presents with  . Left Hip - Pain  . Left Knee - Pain   HPI:  Megan Wong is a 85 y.o. female who comes in today for planned Left anesthetic hip greater trochanteric injection with fluoroscopic guidance.  The patient has failed conservative care including home exercise, medications, time and activity modification.  This injection will be diagnostic and hopefully therapeutic.  Please see requesting physician notes for further details and justification.   ROS Otherwise per HPI.  Assessment & Plan: Visit Diagnoses:    ICD-10-CM   1. Greater trochanteric bursitis, left  M70.62 XR C-ARM NO REPORT    Plan: No additional findings.   Meds & Orders: No orders of the defined types were placed in this encounter.   Orders Placed This Encounter  Procedures  . XR C-ARM NO REPORT    Follow-up: Return if symptoms worsen or fail to improve.   Procedures: No procedures performed      Clinical History: MRI LUMBAR SPINE WITHOUT CONTRAST 02/20/2015  TECHNIQUE: Multiplanar, multisequence MR imaging of the lumbar spine was performed. No intravenous contrast was administered.  COMPARISON: 12/31/2010  FINDINGS: No marrow signal abnormality suggestive of fracture, infection, or neoplasm. Normal conus signal and morphology. No perispinal abnormality to explain back pain. Bilateral renal sinus cysts are noted.  Degenerative changes:  T12- L1: Spondylosis without herniation or impingement.  L1-L2: Ventral spondylotic spurring. No herniation or impingement. Negative facets.  L2-L3: Bulky ventral spondylotic spurring. No herniation. No impingement. Negative facets.  L3-L4: Spondylotic endplate spurring and mild annulus bulging. No disc herniation. Facet arthropathy,  mild, with ligamentum flavum thickening.  L4-L5: Mild annulus bulging. Degenerative ligamentous overgrowth and facet spurring. No herniation or impingement.  L5-S1:Annulus bulging and spondylotic spurring. Most advanced level of facet arthropathy with advanced joint narrowing and bilateral facet spurring. Small left foraminal protrusion, chronic and without neural impingement.  IMPRESSION: Lower lumbar facet arthropathy with milder disc degeneration, described above and stable from 2012. No compressive stenosis.     Objective:  VS:  HT:    WT:   BMI:     BP:   HR: bpm  TEMP: ( )  RESP:  Physical Exam Vitals and nursing note reviewed.  Constitutional:      General: She is not in acute distress.    Appearance: Normal appearance. She is not ill-appearing.  HENT:     Head: Normocephalic and atraumatic.     Right Ear: External ear normal.     Left Ear: External ear normal.  Eyes:     Extraocular Movements: Extraocular movements intact.  Cardiovascular:     Rate and Rhythm: Normal rate.     Pulses: Normal pulses.  Pulmonary:     Effort: Pulmonary effort is normal. No respiratory distress.  Abdominal:     General: There is no distension.     Palpations: Abdomen is soft.  Musculoskeletal:        General: Tenderness present.     Cervical back: Neck supple.     Right lower leg: No edema.     Left lower leg: No edema.     Comments: Patient has good distal strength with concordant pain over the left greater trochanters.  No clonus or focal weakness.  Skin:    Findings: No erythema,  lesion or rash.  Neurological:     General: No focal deficit present.     Mental Status: She is alert and oriented to person, place, and time.     Sensory: No sensory deficit.     Motor: No weakness or abnormal muscle tone.     Coordination: Coordination normal.  Psychiatric:        Mood and Affect: Mood normal.        Behavior: Behavior normal.      Imaging: No results found.

## 2021-03-21 ENCOUNTER — Telehealth: Payer: Self-pay | Admitting: Physical Medicine and Rehabilitation

## 2021-03-21 NOTE — Telephone Encounter (Signed)
Pt needs to be seen for numbness in foot and leg. CB [phone]

## 2021-03-21 NOTE — Telephone Encounter (Signed)
Pt calling wanted to get an appt sch'd with Dr. Alvester Morin. Pt previously had an inj with Alvester Morin and now states she is having numbness and is concerned. The best call back number is [phone].

## 2021-03-28 ENCOUNTER — Encounter: Payer: Self-pay | Admitting: Physical Medicine and Rehabilitation

## 2021-03-28 ENCOUNTER — Ambulatory Visit: Payer: Medicare PPO | Admitting: Physical Medicine and Rehabilitation

## 2021-03-28 ENCOUNTER — Other Ambulatory Visit: Payer: Self-pay

## 2021-03-28 VITALS — BP 126/62 | HR 64

## 2021-03-28 DIAGNOSIS — R102 Pelvic and perineal pain: Secondary | ICD-10-CM | POA: Diagnosis not present

## 2021-03-28 DIAGNOSIS — M47816 Spondylosis without myelopathy or radiculopathy, lumbar region: Secondary | ICD-10-CM

## 2021-03-28 DIAGNOSIS — M5116 Intervertebral disc disorders with radiculopathy, lumbar region: Secondary | ICD-10-CM | POA: Diagnosis not present

## 2021-03-28 DIAGNOSIS — R202 Paresthesia of skin: Secondary | ICD-10-CM | POA: Diagnosis not present

## 2021-03-28 DIAGNOSIS — G5731 Lesion of lateral popliteal nerve, right lower limb: Secondary | ICD-10-CM

## 2021-03-28 MED ORDER — GABAPENTIN 100 MG PO CAPS: ORAL_CAPSULE | ORAL | 0 refills | Status: AC

## 2021-03-28 NOTE — Progress Notes (Signed)
Pt state right foot and below knee calf numbness. Pt state about three weeks ago while she walking she feels numbness. Pt state her foot feels weak.  Numeric Pain Rating Scale and Functional Assessment Average Pain 5 Pain Right Now 5 My pain is constant, tingling, and aching Pain is worse with: walking and sitting Pain improves with: heat/ice and medication   In the last MONTH (on 0-10 scale) has pain interfered with the following?  1. General activity like being  able to carry out your everyday physical activities such as walking, climbing stairs, carrying groceries, or moving a chair?  Rating(4)  2. Relation with others like being able to carry out your usual social activities and roles such as  activities at home, at work and in your community. Rating(2)  3. Enjoyment of life such that you have  been bothered by emotional problems such as feeling anxious, depressed or irritable?  Rating(1)

## 2021-03-28 NOTE — Progress Notes (Addendum)
Megan Wong - 86 y.o. female MRN [id number]  Date of birth: 05-28-1935  Office Visit Note: Visit Date: 03/28/2021 PCP: Harvie HeckJackson, Mishi, MD Referred by: Harvie HeckJackson, Mishi, MD  Subjective: Chief Complaint  Patient presents with   Right Knee - Numbness   Right Foot - Numbness   HPI: Megan Wong is a 86 y.o. female who comes in today for evaluation of chronic, worsening and severe right lateral knee numbness radiating down leg to dorsum of foot.  Patient's daughter is accompanying her today during her visit. Patient reports symptoms ongoing for several months. Patient reports symptoms are exacerbated by crossing legs and sitting for prolonged period of time, patient describes symptoms as numbness and tingling sensation, does not endorse pain at this time. Patient reports nothing seems to alleviate her pain and states she is very uncomfortable in any position.  Patient states she has taken Gabapentin in the past for neurogenic pain, however she is unsure if she was able to tolerate this medication. patient's lumbar MRI from 2018 exhibits multilevel facet hypertrophy, lateral disc bulging and mild left foraminal stenosis at L5-S1.  No high-grade spinal canal stenosis noted.  Patient has a history of multiple transforaminal epidural steroid injections and greater trochanteric bursa injections performed by Dr. Tyrell AntonioFrederic Assia Meanor, all of which she reports did help to alleviate pain.  Patient's daughter reports the patient has been more sedentary recently and has been sitting with her legs crossed for prolonged periods of time.  Patient reports the symptoms she is currently experiencing are different than any previous symptoms she has had in the past.  Patient's daughter also states her mother has lost 100 pounds over the last 2 years.  Patient denies focal weakness.  Patient denies recent trauma or falls.  Patient denies recent injury to her right lower extremity.  Review of Systems  Neurological:  Positive for  tingling, sensory change and weakness.       Pt reports numbness and tingling to right lateral knee down leg to top of foot for several months.   All other systems reviewed and are negative. Otherwise per HPI.  Assessment & Plan: Visit Diagnoses:    ICD-10-CM   1. Peroneal nerve palsy, right  G57.31 gabapentin (NEURONTIN) 100 MG capsule    Ambulatory referral to Physical Medicine Rehab    2. Paresthesia of skin  R20.2     3. Intervertebral disc disorders with radiculopathy, lumbar region  M51.16 MR LUMBAR SPINE WO CONTRAST    4. Facet hypertrophy of lumbar region  M47.816        Plan: Findings:  Chronic, worsening and severe right lateral knee numbness radiating down leg to dorsum of foot.  Patient's symptoms are new and unlike previous issues she is experienced with her lower back.  Patient's clinical presentation and exam are consistent with peroneal nerve entrapment, however we cannot rule out possible L5 nerve pattern.  Patient does exhibit 4 out of 5 strength with dorsiflexion of her right foot upon exam today.  We feel the next step is to place an order for a right lower extremity NCV with EMG to evaluate for peroneal nerve entrapment, we also placed an order today for new lumbar MRI imaging as her last set of images are from 2018.  We did discuss in detail possible medication management to help with the numbness and tingling in her right lower extremity.  We educated patient on the pharmacodynamics of Gabapentin and feel this would be a good option to  help treat her neurogenic pain.  We did place a prescription for Gabapentin today and also discussed importance of gradually increasing dose as this medication can cause drowsiness.  Patient and daughter have no questions at this time.  We feel that we can get patient in quickly for her right lower extremity nerve study.  We will have patient follow-up with [place] after nerve study and lumbar MRI images have resulted for discussion of further  treatment options.   Meds & Orders:  Meds ordered this encounter  Medications   gabapentin (NEURONTIN) 100 MG capsule    Sig: Take 1 capsule (100 mg) by mouth at bedtime for 7 days, then take 2 capsules (200 mg) by mouth at bedtime for 7 days, then take 3 capsules (300 mg) by mouth at bedtime.    Dispense:  60 capsule    Refill:  0    Order Specific Question:   Supervising Provider    Answer:   Tyrell Antonio [[id number]]    Orders Placed This Encounter  Procedures   MR LUMBAR SPINE WO CONTRAST   Ambulatory referral to Physical Medicine Rehab    Follow-up: Return for Right Lower Extremity NCV with EMG.   Procedures: No procedures performed      Clinical History: MRI LUMBAR SPINE WITHOUT CONTRAST   TECHNIQUE: Multiplanar, multisequence MR imaging of the lumbar spine was performed. No intravenous contrast was administered.   COMPARISON:  12/25/2016   FINDINGS: Segmentation: Standard. Consistent numbering is applied compared to 12/25/2016.   Alignment:  Normal   Vertebrae:  No fracture, evidence of discitis, or bone lesion.   Conus medullaris and cauda equina: Conus extends to the L1 level. Conus and cauda equina appear normal.   Paraspinal and other soft tissues: Unchanged bilateral small renal sinus cysts.   Disc levels:   L1-L2: Normal disc space and facet joints. No spinal canal stenosis. No neural foraminal stenosis.   L2-L3: Normal disc space and facet joints. No spinal canal stenosis. No neural foraminal stenosis.   L3-L4: Moderate right facet hypertrophy, worsened. Small disc bulge. Mild crowding in the right lateral recess but no impingement or central spinal canal stenosis. No neural foraminal stenosis.   L4-L5: Unchanged mild disc bulge. Normal facets. No spinal canal stenosis. No neural foraminal stenosis.   L5-S1: Unchanged moderate facet hypertrophy. Unchanged left extraforaminal disc protrusion. No spinal canal stenosis. Mild left neural  foraminal stenosis.   Visualized sacrum: Normal.   IMPRESSION: 1. Slight worsening of moderate right L3-4 facet arthrosis that causes some crowding of the nerve roots in the subarticular recess. 2. Unchanged mild left L5-S1 neural foraminal stenosis with left extraforaminal disc protrusion. 3. No central spinal canal stenosis.     Electronically Signed   By: Deatra Robinson M.D.   On: 04/22/2021 12:36   She reports that she quit smoking about 61 years ago. She has never used smokeless tobacco. No results for input(s): HGBA1C, LABURIC in the last 8760 hours.  Objective:  VS:  HT:     WT:    BMI:      BP:126/62   HR:64bpm   TEMP: ( )   RESP:  Physical Exam Vitals and nursing note reviewed.  HENT:     Head: Normocephalic and atraumatic.     Right Ear: External ear normal.     Left Ear: External ear normal.     Nose: Nose normal.     Mouth/Throat:     Mouth: Mucous membranes are moist.  Eyes:  Extraocular Movements: Extraocular movements intact.  Cardiovascular:     Rate and Rhythm: Normal rate.     Pulses: Normal pulses.  Pulmonary:     Effort: Pulmonary effort is normal.  Abdominal:     General: Abdomen is flat. There is no distension.  Musculoskeletal:        General: Tenderness present.     Cervical back: Normal range of motion.     Comments: Pt is slow to rise from seated position to standing. Good lumbar range of motion. 4 out of 5 strength noted upon dorsiflexion of right foot.  No pain upon palpation of greater trochanters. Sensation intact bilaterally. Walks independently, gait slow.   Skin:    General: Skin is warm and dry.     Capillary Refill: Capillary refill takes less than 2 seconds.  Neurological:     Mental Status: She is alert and oriented to person, place, and time.     Motor: Weakness present.  Psychiatric:        Mood and Affect: Mood normal.        Behavior: Behavior normal.    Ortho Exam  Imaging: No results found.  Past  Medical/Family/Surgical/Social History: Medications & Allergies reviewed per EMR, new medications updated. Patient Active Problem List   Diagnosis Date Noted   Carpal tunnel syndrome, left upper limb 11/26/2017   Fibromyalgia 01/21/2017   Chronic pain syndrome 01/21/2017   Spondylosis without myelopathy or radiculopathy, lumbar region 01/21/2017   H/O total hip arthroplasty, right 01/21/2017   Incomplete rotator cuff tear right shoulder 07/08/2013   Past Medical History:  Diagnosis Date   Arthritis    Coronary artery disease    STENT 7 YRS AGO - followed by Dr. Leeann Must in [place]    Fibromyalgia    History of nonmelanoma skin cancer    History of palpitations    takes Metoprolol - pt denies any Hx HBP   RCT (rotator cuff tear)    RT SHOULDER   Rupture of biceps tendon    RT   History reviewed. No pertinent family history. Past Surgical History:  Procedure Laterality Date   BREAST SURGERY  "MANY YRS AGO"   CHRONIC MASTITIS SURG   CARDIAC CATHETERIZATION     CHOLECYSTECTOMY     JOINT REPLACEMENT  2012   RT HIP   SHOULDER ARTHROSCOPY WITH ROTATOR CUFF REPAIR Right 07/08/2013   Procedure: RIGHT SHOULDER ARTHROSCOPY WITH DEBRIDEMENT, Subacromial Decompression;  Surgeon: Kathryne Hitch, MD;  Location: [facility];  Service: Orthopedics;  Laterality: Right;   STENTED CORONARY ARTERY  2008   Social History   Occupational History   Not on file  Tobacco Use   Smoking status: Former    Types: Cigarettes    Quit date: 07/05/1959    Years since quitting: 61.8   Smokeless tobacco: Never  Substance and Sexual Activity   Alcohol use: No   Drug use: No   Sexual activity: Not on file

## 2021-04-05 ENCOUNTER — Ambulatory Visit (INDEPENDENT_AMBULATORY_CARE_PROVIDER_SITE_OTHER): Payer: Medicare PPO | Admitting: Physical Medicine and Rehabilitation

## 2021-04-05 ENCOUNTER — Other Ambulatory Visit: Payer: Self-pay

## 2021-04-05 ENCOUNTER — Encounter: Payer: Self-pay | Admitting: Physical Medicine and Rehabilitation

## 2021-04-05 DIAGNOSIS — R202 Paresthesia of skin: Secondary | ICD-10-CM | POA: Diagnosis not present

## 2021-04-05 NOTE — Progress Notes (Signed)
Pt state right shin and foot numbness and tingling. Pt state she has weakness in her right leg.  Numeric Pain Rating Scale and Functional Assessment Average Pain 10   In the last MONTH (on 0-10 scale) has pain interfered with the following?  1. General activity like being  able to carry out your everyday physical activities such as walking, climbing stairs, carrying groceries, or moving a chair?  Rating(10)

## 2021-04-09 NOTE — Progress Notes (Signed)
Megan Wong - 86 y.o. female MRN [id number]  Date of birth: [birth year]  Office Visit Note: Visit Date: 04/05/2021 PCP: Harvie HeckJackson, Mishi, MD Referred by: Harvie HeckJackson, Mishi, MD  Subjective: Chief Complaint  Patient presents with   Right Leg - Numbness, Weakness, Tingling   Right Foot - Tingling, Numbness, Weakness   HPI:  Megan Wong is a 86 y.o. female who comes in today at the request of Ellin GoodieMegan Williams, FNP for electrodiagnostic study of the Right upper extremities.  Patient is Right hand dominant.  Please see our office note which was recently completed for further details and justification.  By brief review patient is having numbness tingling and weakness in the right lower extremity from the knee down.  Seems to be most consistent with a fibular nerve neuropathy but she has had some issues with her spine in the past and cannot rule out L5 radiculopathy.  MRI of the lumbar spine is on order.  ROS Otherwise per HPI.  Assessment & Plan: Visit Diagnoses:    ICD-10-CM   1. 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  However, can is still not totally rule out an L5 radiculopathy.  There is no significant electrodiagnostic evidence of any other focal nerve entrapment or generalized peripheral neuropathy in the right lower limb.   Recommendations: 1.  Follow-up with referring physician. 2.  Continue current management of symptoms.  MRI of the lumbar spine is ordered.  Meds & Orders: No orders of the defined types were placed in this encounter.   Orders Placed This Encounter  Procedures   NCV with EMG (electromyography)    Follow-up: Return for MRI review after completion.   Procedures: No procedures performed  EMG & NCV Findings: Evaluation of the right fibular motor  nerve showed decreased conduction velocity (Poplt-B Fib, 33 m/s).  The right saphenous sensory nerve showed no response (14cm).  The right sural sensory nerve showed prolonged distal peak latency (4.2 ms) and decreased conduction velocity (Calf-Lat Mall, 33 m/s).  All remaining nerves (as indicated in the following tables) were within normal limits.    Needle evaluation of the right anterior tibialis muscle showed increased insertional activity, moderately increased spontaneous activity, and slightly increased polyphasic potentials.  The right Fibularis Longus muscle showed increased insertional activity and moderately increased spontaneous activity.  All remaining muscles (as indicated in the following table) showed no evidence of electrical instability.    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  However, can is still not totally rule out an L5 radiculopathy.  There is no significant electrodiagnostic evidence of any other focal nerve entrapment or generalized peripheral neuropathy in the right lower limb.   Recommendations: 1.  Follow-up with referring physician. 2.  Continue current management of symptoms.  MRI of the lumbar spine is ordered.  ___________________________ Naaman PlummerFred Elayna Tobler FAAPMR Board Certified, American Board of Physical Medicine and Rehabilitation    Nerve Conduction Studies Anti Sensory Summary Table   Stim Site NR Peak (ms) Norm Peak (ms) P-T Amp (V) Norm P-T Amp Site1 Site2 Delta-P (ms) Dist (cm) Vel (m/s) Norm Vel (m/s)  Right Saphenous Anti Sensory (Ant Med Mall)  26.5C  14cm *NR  <  4.4  >2 14cm Ant Med Mall  0.0  >32  Right Sup Fibular Anti Sensory (Ant Lat Mall)  26.9C  14 cm    3.3 <4.4 8.8 >5.0 14 cm Ant Lat Mall 3.3 14.0 42 >32  Right Sural Anti Sensory (Lat Mall)  26.6C  Calf    *4.2 <4.0 12.6 >5.0 Calf Lat  Mall 4.2 14.0 *33 >35   Motor Summary Table   Stim Site NR Onset (ms) Norm Onset (ms) O-P Amp (mV) Norm O-P Amp Site1 Site2 Delta-0 (ms) Dist (cm) Vel (m/s) Norm Vel (m/s)  Right Fibular Motor (Ext Dig Brev)  26.7C  Ankle    5.1 <6.1 3.7 >2.5 B Fib Ankle 7.4 29.0 39 >38  B Fib    12.5  2.1  Poplt B Fib 3.0 10.0 *33 >40  Poplt    15.5  1.3         Right Tibial Motor (Abd Hall Brev)  26.9C  Ankle    4.3 <6.1 12.0 >3.0 Knee Ankle 7.7 36.0 47 >35  Knee    12.0  7.9          EMG   Side Muscle Nerve Root Ins Act Fibs Psw Amp Dur Poly Recrt Int Dennie Bible Comment  Right AntTibialis Dp Br Peron L4-5 *Incr *2+ *2+ Nml Nml *1+ Nml Nml   Right Fibularis Longus  Sup Br Peron L5-S1 *Incr *2+ *2+ Nml Nml 0 Nml Nml   Right MedGastroc Tibial S1-2 Nml Nml Nml Nml Nml 0 Nml Nml   Right VastusMed Femoral L2-4 Nml Nml Nml Nml Nml 0 Nml Nml   Right BicepsFemS Sciatic L5-S1 Nml Nml Nml Nml Nml 0 Nml Nml     Nerve Conduction Studies Anti Sensory Left/Right Comparison   Stim Site L Lat (ms) R Lat (ms) L-R Lat (ms) L Amp (V) R Amp (V) L-R Amp (%) Site1 Site2 L Vel (m/s) R Vel (m/s) L-R Vel (m/s)  Saphenous Anti Sensory (Ant Med Mall)  26.5C  14cm       14cm Ant Med Mall     Sup Fibular Anti Sensory (Ant Lat Mall)  26.9C  14 cm  3.3   8.8  14 cm Ant Lat Mall  42   Sural Anti Sensory (Lat Mall)  26.6C  Calf  *4.2   12.6  Calf Lat Mall  *33    Motor Left/Right Comparison   Stim Site L Lat (ms) R Lat (ms) L-R Lat (ms) L Amp (mV) R Amp (mV) L-R Amp (%) Site1 Site2 L Vel (m/s) R Vel (m/s) L-R Vel (m/s)  Fibular Motor (Ext Dig Brev)  26.7C  Ankle  5.1   3.7  B Fib Ankle  39   B Fib  12.5   2.1  Poplt B Fib  *33   Poplt  15.5   1.3        Tibial Motor (Abd Hall Brev)  26.9C  Ankle  4.3   12.0  Knee Ankle  47   Knee  12.0   7.9           Waveforms:             Clinical History: MRI LUMBAR SPINE WITHOUT CONTRAST 02/20/2015   TECHNIQUE: Multiplanar, multisequence MR imaging of the lumbar  spine was performed. No intravenous contrast was administered.   COMPARISON: 12/31/2010   FINDINGS: No marrow signal abnormality suggestive of fracture, infection, or neoplasm. Normal conus signal and morphology. No perispinal abnormality to explain  back pain. Bilateral renal sinus cysts are noted.   Degenerative changes:   T12- L1: Spondylosis without herniation or impingement.   L1-L2: Ventral spondylotic spurring. No herniation or impingement. Negative facets.   L2-L3: Bulky ventral spondylotic spurring. No herniation. No impingement. Negative facets.   L3-L4: Spondylotic endplate spurring and mild annulus bulging. No disc herniation. Facet arthropathy, mild, with ligamentum flavum thickening.   L4-L5: Mild annulus bulging. Degenerative ligamentous overgrowth and facet spurring. No herniation or impingement.   L5-S1:Annulus bulging and spondylotic spurring. Most advanced level of facet arthropathy with advanced joint narrowing and bilateral facet spurring. Small left foraminal protrusion, chronic and without neural impingement.   IMPRESSION: Lower lumbar facet arthropathy with milder disc degeneration, described above and stable from 2012. No compressive stenosis.     Objective:  VS:  HT:     WT:    BMI:      BP:    HR: bpm   TEMP: ( )   RESP:  Physical Exam Musculoskeletal:     Right lower leg: No edema.     Left lower leg: No edema.  Skin:    General: Skin is warm and dry.     Capillary Refill: Capillary refill takes less than 2 seconds.     Findings: No bruising or erythema.  Neurological:     Mental Status: She is oriented to person, place, and time.     Sensory: Sensory deficit present.     Motor: Weakness present.     Coordination: Coordination normal.     Gait: Gait normal.     Comments: She has a positive Tinel's over the right fibular head versus the left.  She has decreased sensation in more of a fibular nerve distribution on the right and not on the  left.  Has some normal for age atrophy of the intrinsic foot musculature bilaterally.  She does have mild weakness with dorsiflexion as well as EHL on the right compared to left.  Psychiatric:        Mood and Affect: Mood normal.        Behavior: Behavior normal.        Thought Content: Thought content normal.     Imaging: No results found.

## 2021-04-09 NOTE — Procedures (Signed)
EMG & NCV Findings: Evaluation of the right fibular motor nerve showed decreased conduction velocity (Poplt-B Fib, 33 m/s).  The right saphenous sensory nerve showed no response (14cm).  The right sural sensory nerve showed prolonged distal peak latency (4.2 ms) and decreased conduction velocity (Calf-Lat Mall, 33 m/s).  All remaining nerves (as indicated in the following tables) were within normal limits.    Needle evaluation of the right anterior tibialis muscle showed increased insertional activity, moderately increased spontaneous activity, and slightly increased polyphasic potentials.  The right Fibularis Longus muscle showed increased insertional activity and moderately increased spontaneous activity.  All remaining muscles (as indicated in the following table) showed no evidence of electrical instability.    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  However, can is still not totally rule out an L5 radiculopathy.  There is no significant electrodiagnostic evidence of any other focal nerve entrapment or generalized peripheral neuropathy in the right lower limb.   Recommendations: 1.  Follow-up with referring physician. 2.  Continue current management of symptoms.  MRI of the lumbar spine is ordered.  ___________________________ Naaman Plummer FAAPMR Board Certified, American Board of Physical Medicine and Rehabilitation    Nerve Conduction Studies Anti Sensory Summary Table   Stim Site NR Peak (ms) Norm Peak (ms) P-T Amp (V) Norm P-T Amp Site1 Site2 Delta-P (ms) Dist (cm) Vel (m/s) Norm Vel (m/s)  Right Saphenous Anti Sensory (Ant Med Mall)  26.5C  14cm *NR  <4.4  >2 14cm Ant Med Mall  0.0  >32  Right Sup Fibular Anti Sensory (Ant Lat Mall)  26.9C  14 cm    3.3 <4.4 8.8 >5.0 14 cm Ant Lat Mall 3.3 14.0 42 >32  Right Sural Anti Sensory  (Lat Mall)  26.6C  Calf    *4.2 <4.0 12.6 >5.0 Calf Lat Mall 4.2 14.0 *33 >35   Motor Summary Table   Stim Site NR Onset (ms) Norm Onset (ms) O-P Amp (mV) Norm O-P Amp Site1 Site2 Delta-0 (ms) Dist (cm) Vel (m/s) Norm Vel (m/s)  Right Fibular Motor (Ext Dig Brev)  26.7C  Ankle    5.1 <6.1 3.7 >2.5 B Fib Ankle 7.4 29.0 39 >38  B Fib    12.5  2.1  Poplt B Fib 3.0 10.0 *33 >40  Poplt    15.5  1.3         Right Tibial Motor (Abd Hall Brev)  26.9C  Ankle    4.3 <6.1 12.0 >3.0 Knee Ankle 7.7 36.0 47 >35  Knee    12.0  7.9          EMG   Side Muscle Nerve Root Ins Act Fibs Psw Amp Dur Poly Recrt Int Dennie Bible Comment  Right AntTibialis Dp Br Peron L4-5 *Incr *2+ *2+ Nml Nml *1+ Nml Nml   Right Fibularis Longus  Sup Br Peron L5-S1 *Incr *2+ *2+ Nml Nml 0 Nml Nml   Right MedGastroc Tibial S1-2 Nml Nml Nml Nml Nml 0 Nml Nml   Right VastusMed Femoral L2-4 Nml Nml Nml Nml Nml 0 Nml Nml   Right BicepsFemS Sciatic L5-S1 Nml Nml Nml Nml Nml 0 Nml Nml     Nerve Conduction Studies Anti Sensory Left/Right Comparison   Stim Site L Lat (ms) R Lat (ms) L-R Lat (ms) L Amp (V) R Amp (V) L-R Amp (%) Site1 Site2 L Vel (m/s) R  Vel (m/s) L-R Vel (m/s)  Saphenous Anti Sensory (Ant Med Mall)  26.5C  14cm       14cm Ant Med Mall     Sup Fibular Anti Sensory (Ant Lat Mall)  26.9C  14 cm  3.3   8.8  14 cm Ant Lat Mall  42   Sural Anti Sensory (Lat Mall)  26.6C  Calf  *4.2   12.6  Calf Lat Mall  *33    Motor Left/Right Comparison   Stim Site L Lat (ms) R Lat (ms) L-R Lat (ms) L Amp (mV) R Amp (mV) L-R Amp (%) Site1 Site2 L Vel (m/s) R Vel (m/s) L-R Vel (m/s)  Fibular Motor (Ext Dig Brev)  26.7C  Ankle  5.1   3.7  B Fib Ankle  39   B Fib  12.5   2.1  Poplt B Fib  *33   Poplt  15.5   1.3        Tibial Motor (Abd Hall Brev)  26.9C  Ankle  4.3   12.0  Knee Ankle  47   Knee  12.0   7.9           Waveforms:

## 2021-04-20 ENCOUNTER — Ambulatory Visit
Admission: RE | Admit: 2021-04-20 | Discharge: 2021-04-20 | Disposition: A | Discharge status: Home / Self Care | Payer: Medicare PPO | Source: Ambulatory Visit | Attending: Physical Medicine and Rehabilitation | Admitting: Physical Medicine and Rehabilitation

## 2021-04-20 ENCOUNTER — Other Ambulatory Visit: Payer: Self-pay

## 2021-04-22 ENCOUNTER — Ambulatory Visit: Payer: Medicare PPO | Admitting: Physical Medicine and Rehabilitation

## 2021-04-22 ENCOUNTER — Other Ambulatory Visit: Payer: Self-pay

## 2021-04-22 ENCOUNTER — Encounter: Payer: Self-pay | Admitting: Physical Medicine and Rehabilitation

## 2021-04-22 VITALS — BP 131/75 | HR 71

## 2021-04-22 DIAGNOSIS — M797 Fibromyalgia: Secondary | ICD-10-CM | POA: Diagnosis not present

## 2021-04-22 DIAGNOSIS — M7918 Myalgia, other site: Secondary | ICD-10-CM | POA: Diagnosis not present

## 2021-04-22 DIAGNOSIS — G5731 Lesion of lateral popliteal nerve, right lower limb: Secondary | ICD-10-CM | POA: Diagnosis not present

## 2021-04-22 NOTE — Progress Notes (Signed)
Megan Wong - 86 y.o. female MRN [id number]  Date of birth: 01-May-1935  Office Visit Note: Visit Date: 04/22/2021 PCP: Megan Heck, MD Referred by: Megan Heck, MD  Subjective: No chief complaint on file.  HPI: Megan Wong is a 86 y.o. female who comes in today for evaluation of chronic, worsening and severe right lateral knee numbness radiating down leg to dorsum of foot. Patient reports symptoms have been ongoing for several months. Patient also reports soreness to entire right leg that has been ongoing for 2 weeks. Patient reports pain is exacerbated by movement and activity. She describes pain as a tingling and sore sensation, currently rates as 7 out of 10.  Patient states she does take Tylenol as needed for pain, however no treatment seems to alleviate her pain and reports she has to change positions frequently to remain comfortable.  Patients recent lumbar MRI exhibits multi-level facet hypertrophy, worsening of moderate right L3-L4 facet arthrosis that could lead to crowding of the nerves in the subarticular recess but no frank compression, there is also unchanged mild left L5-S1 foraminal stenosis with left lateral disc protrusion. No high grade spinal canal stenosis noted and no findings near the L5 nerve root. Patient has a history of multiple transforaminal epidural steroid injections and greater trochanteric bursa injections performed by Dr. Tyrell Wong, all of which she reports did help to alleviate pain in the past but has reported this as a different pain. 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 Patient states she continues to have right foot weakness, but states she is able to walk without difficulty. Patient denies recent trauma or falls. She does have a history of fibromyalgia  Review of Systems  Musculoskeletal:  Positive for myalgias.  Neurological:  Positive for tingling, sensory  change and weakness.       Pt reports numbness and tingling to right lateral knee down leg to top of foot for several months.    All other systems reviewed and are negative. Otherwise per HPI.  Assessment & Plan: Visit Diagnoses:    ICD-10-CM   1. Neuralgia of right peroneal nerve  G57.31 Ambulatory referral to Orthopedic Surgery    2. Myofascial pain syndrome  M79.18     3. Fibromyalgia  M79.7        Plan: Findings:  Chronic, worsening and severe right lateral knee numbness radiating down leg to dorsum of foot.  Patient continues to have excruciating and debilitating pain despite good conservative therapy such as rest and use of medications. Patient's clinical presentation and exam are consistent with neuralgia of the right peroneal(fibular) nerve. We also feel the diffuse pain to entire right leg could be an amplification related to her fibromyalgia. Patient's recent lumbar MRI looks good from a spine standpoint and does not seem to directly correlate with her symptoms. 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 We feel the next step is to follow-up with Dr. Burnard Wong to discuss further treatment options such as surgical intervention. We will help patient arrange follow-up with Dr. August Wong today. Patient instructed to let Megan Wong know if her symptoms worsen or change in nature.   Meds & Orders: No orders of the defined types were placed in this encounter.   Orders Placed This Encounter  Procedures   Ambulatory referral to Orthopedic Surgery    Follow-up: Return if symptoms worsen or fail to improve.  Procedures: No procedures performed      Clinical History: MRI LUMBAR SPINE WITHOUT CONTRAST   TECHNIQUE: Multiplanar, multisequence MR imaging of the lumbar spine was performed. No intravenous contrast was administered.   COMPARISON:  12/25/2016   FINDINGS: Segmentation: Standard. Consistent numbering is applied compared  to 12/25/2016.   Alignment:  Normal   Vertebrae:  No fracture, evidence of discitis, or bone lesion.   Conus medullaris and cauda equina: Conus extends to the L1 level. Conus and cauda equina appear normal.   Paraspinal and other soft tissues: Unchanged bilateral small renal sinus cysts.   Disc levels:   L1-L2: Normal disc space and facet joints. No spinal canal stenosis. No neural foraminal stenosis.   L2-L3: Normal disc space and facet joints. No spinal canal stenosis. No neural foraminal stenosis.   L3-L4: Moderate right facet hypertrophy, worsened. Small disc bulge. Mild crowding in the right lateral recess but no impingement or central spinal canal stenosis. No neural foraminal stenosis.   L4-L5: Unchanged mild disc bulge. Normal facets. No spinal canal stenosis. No neural foraminal stenosis.   L5-S1: Unchanged moderate facet hypertrophy. Unchanged left extraforaminal disc protrusion. No spinal canal stenosis. Mild left neural foraminal stenosis.   Visualized sacrum: Normal.   IMPRESSION: 1. Slight worsening of moderate right L3-4 facet arthrosis that causes some crowding of the nerve roots in the subarticular recess. 2. Unchanged mild left L5-S1 neural foraminal stenosis with left extraforaminal disc protrusion. 3. No central spinal canal stenosis.     Electronically Signed   By: Deatra Robinson M.D.   On: 04/22/2021 12:36   She reports that she quit smoking about 61 years ago. She has never used smokeless tobacco. No results for input(s): HGBA1C, LABURIC in the last 8760 hours.  Objective:  VS:  HT:     WT:    BMI:      BP:131/75   HR:71bpm   TEMP: ( )   RESP:  Physical Exam Vitals and nursing note reviewed.  HENT:     Head: Normocephalic and atraumatic.     Right Ear: External ear normal.     Left Ear: External ear normal.     Nose: Nose normal.     Mouth/Throat:     Mouth: Mucous membranes are moist.  Eyes:     Extraocular Movements: Extraocular  movements intact.  Cardiovascular:     Rate and Rhythm: Normal rate.     Pulses: Normal pulses.  Pulmonary:     Effort: Pulmonary effort is normal.  Abdominal:     General: Abdomen is flat. There is no distension.  Musculoskeletal:        General: Tenderness present.     Cervical back: Normal range of motion.     Right lower leg: No edema.     Left lower leg: No edema.     Comments: Pt is slow to rise from seated position to standing. Good lumbar range of motion. 4 out of 5 strength noted upon dorsiflexion of right foot.  No pain upon palpation of greater trochanters. Sensation intact bilaterally. Walks independently, gait slow.  Positive Tinel's over right fibular head.   Skin:    General: Skin is warm and dry.     Capillary Refill: Capillary refill takes less than 2 seconds.     Findings: No erythema, lesion or rash.  Neurological:     Mental Status: She is alert and oriented to person, place, and time.     Sensory: Sensory deficit present.  Motor: Weakness present.     Gait: Gait abnormal.  Psychiatric:        Mood and Affect: Mood normal.        Behavior: Behavior normal.    Ortho Exam  Imaging: No results found.  Past Medical/Family/Surgical/Social History: Medications & Allergies reviewed per EMR, new medications updated. Patient Active Problem List   Diagnosis Date Noted   Carpal tunnel syndrome, left upper limb 11/26/2017   Fibromyalgia 01/21/2017   Chronic pain syndrome 01/21/2017   Spondylosis without myelopathy or radiculopathy, lumbar region 01/21/2017   H/O total hip arthroplasty, right 01/21/2017   Incomplete rotator cuff tear right shoulder 07/08/2013   Past Medical History:  Diagnosis Date   Arthritis    Coronary artery disease    STENT 7 YRS AGO - followed by Dr. Leeann Mustenaldo in [place]    Fibromyalgia    History of nonmelanoma skin cancer    History of palpitations    takes Metoprolol - pt denies any Hx HBP   RCT (rotator cuff tear)    RT  SHOULDER   Rupture of biceps tendon    RT   History reviewed. No pertinent family history. Past Surgical History:  Procedure Laterality Date   BREAST SURGERY  "MANY YRS AGO"   CHRONIC MASTITIS SURG   CARDIAC CATHETERIZATION     CHOLECYSTECTOMY     JOINT REPLACEMENT  2012   RT HIP   SHOULDER ARTHROSCOPY WITH ROTATOR CUFF REPAIR Right 07/08/2013   Procedure: RIGHT SHOULDER ARTHROSCOPY WITH DEBRIDEMENT, Subacromial Decompression;  Surgeon: Kathryne Hitchhristopher Y Blackman, MD;  Location: [facility];  Service: Orthopedics;  Laterality: Right;   STENTED CORONARY ARTERY  2008   Social History   Occupational History   Not on file  Tobacco Use   Smoking status: Former    Types: Cigarettes    Quit date: 07/05/1959    Years since quitting: 61.8   Smokeless tobacco: Never  Substance and Sexual Activity   Alcohol use: No   Drug use: No   Sexual activity: Not on file

## 2021-04-22 NOTE — Progress Notes (Signed)
Pt state she has numbness on her right shin to the top of her right foot. Pt state she doesn't know what causes the numbness.  Numeric Pain Rating Scale and Functional Assessment Average Pain 10 Pain Right Now 10 My pain is constant and numbness Pain is worse with: unsure Pain improves with: medication   In the last MONTH (on 0-10 scale) has pain interfered with the following?  1. General activity like being  able to carry out your everyday physical activities such as walking, climbing stairs, carrying groceries, or moving a chair?  Rating(7)  2. Relation with others like being able to carry out your usual social activities and roles such as  activities at home, at work and in your community. Rating(8)  3. Enjoyment of life such that you have  been bothered by emotional problems such as feeling anxious, depressed or irritable?  Rating(9)

## 2021-05-09 ENCOUNTER — Ambulatory Visit: Payer: Medicare PPO | Admitting: Orthopedic Surgery

## 2021-05-09 ENCOUNTER — Other Ambulatory Visit: Payer: Self-pay

## 2021-05-09 ENCOUNTER — Ambulatory Visit (INDEPENDENT_AMBULATORY_CARE_PROVIDER_SITE_OTHER): Payer: Medicare PPO

## 2021-05-09 DIAGNOSIS — M25561 Pain in right knee: Secondary | ICD-10-CM

## 2021-05-10 ENCOUNTER — Encounter: Payer: Self-pay | Admitting: Orthopedic Surgery

## 2021-05-10 NOTE — Progress Notes (Signed)
Office Visit Note   Patient: Megan Wong           Date of Birth: March 16, 1936           MRN: [id number] Visit Date: 05/09/2021 Requested by: Harvie Heck, MD [address] ,  [place] [address] PCP: Harvie Heck, MD  Subjective: Chief Complaint  Patient presents with   Right Knee - Pain    HPI: Megan Wong is an 86 year old patient with right knee pain.  Been going on 3 and half months.  Initially started is very focal pain around the fibular head.  Radiated down the leg into the dorsal foot.  Denies any low back pain.  She describes a slapping foot gait which started first and then the numbness started thereafter.  She states that her leg feels weak.  She is not as confident with the right leg.  Does have a history of right total hip replacement.  Also has a history of gout.  The gout usually affects her feet.  She has had MRI scan of the lumbar spine which is reviewed.  That shows moderate right-sided L3-4 facet arthrosis that causes some crowding of the nerve roots.  No central spinal canal stenosis.              ROS: All systems reviewed are negative as they relate to the chief complaint within the history of present illness.  Patient denies  fevers or chills.   Assessment & Plan: Visit Diagnoses:  1. Right knee pain, unspecified chronicity     Plan: Impression is right knee pain with slight weakness on dorsiflexion and numbness in the peroneal distribution.  No nerve root tension signs.  I do not palpate any masses around the fibular head but she may have a cyst or something of that nature compressing the peroneal nerve.  Alternatively this could be related to her hip replacement on the right-hand side.  Last possibility is more irritation than would be expected from L3-4 facet arthritis on the right-hand side.  Because she is having some objective and subjective weakness plan is MRI scan of the knee to evaluate the peroneal nerve at the fibular head for  compression  Follow-Up Instructions: Return for after MRI.   Orders:  Orders Placed This Encounter  Procedures   XR Knee 1-2 Views Right   MR Knee Right w/o contrast   No orders of the defined types were placed in this encounter.     Procedures: No procedures performed   Clinical Data: No additional findings.  Objective: Vital Signs: There were no vitals taken for this visit.  Physical Exam:   Constitutional: Patient appears well-developed HEENT:  Head: Normocephalic Eyes:EOM are normal Neck: Normal range of motion Cardiovascular: Normal rate Pulmonary/chest: Effort normal Neurologic: Patient is alert Skin: Skin is warm Psychiatric: Patient has normal mood and affect   Ortho Exam: Ortho exam demonstrates full active and passive range of motion of the knee and hip on the right.  No effusion in the right knee.  Pedal pulses palpable.  No masses palpable in the fibular head region on either knee.  Ankle dorsiflexion is 5- out of 5 on the right 5+ out of 5 on the left.  Plantarflexion strength symmetric and intact.  She does have somewhat of a slapping foot gait but it is not as bad as it was about 3 to 4 weeks ago according to the patient.  Specialty Comments:  No specialty comments available.  Imaging:  XR Knee 1-2 Views Right  Result Date: 05/10/2021 AP lateral radiographs right knee reviewed.  No significant osteoarthritis is present.  Alignment intact.  No acute fracture.  Faint calcification of the popliteal artery is present.  Patella height normal relative to distal femur    PMFS History: Patient Active Problem List   Diagnosis Date Noted   Carpal tunnel syndrome, left upper limb 11/26/2017   Fibromyalgia 01/21/2017   Chronic pain syndrome 01/21/2017   Spondylosis without myelopathy or radiculopathy, lumbar region 01/21/2017   H/O total hip arthroplasty, right 01/21/2017   Incomplete rotator cuff tear right shoulder 07/08/2013   Past Medical History:   Diagnosis Date   Arthritis    Coronary artery disease    STENT 7 YRS AGO - followed by Dr. Leeann Must in [place]    Fibromyalgia    History of nonmelanoma skin cancer    History of palpitations    takes Metoprolol - pt denies any Hx HBP   RCT (rotator cuff tear)    RT SHOULDER   Rupture of biceps tendon    RT    History reviewed. No pertinent family history.  Past Surgical History:  Procedure Laterality Date   BREAST SURGERY  "MANY YRS AGO"   CHRONIC MASTITIS SURG   CARDIAC CATHETERIZATION     CHOLECYSTECTOMY     JOINT REPLACEMENT  2012   RT HIP   SHOULDER ARTHROSCOPY WITH ROTATOR CUFF REPAIR Right 07/08/2013   Procedure: RIGHT SHOULDER ARTHROSCOPY WITH DEBRIDEMENT, Subacromial Decompression;  Surgeon: Kathryne Hitch, MD;  Location: [facility];  Service: Orthopedics;  Laterality: Right;   STENTED CORONARY ARTERY  2008   Social History   Occupational History   Not on file  Tobacco Use   Smoking status: Former    Types: Cigarettes    Quit date: 07/05/1959    Years since quitting: 61.8   Smokeless tobacco: Never  Substance and Sexual Activity   Alcohol use: No   Drug use: No   Sexual activity: Not on file

## 2021-05-25 ENCOUNTER — Other Ambulatory Visit: Payer: Medicare PPO

## 2021-05-26 ENCOUNTER — Other Ambulatory Visit: Payer: Self-pay

## 2021-05-26 ENCOUNTER — Ambulatory Visit
Admission: RE | Admit: 2021-05-26 | Discharge: 2021-05-26 | Disposition: A | Discharge status: Home / Self Care | Payer: Medicare PPO | Source: Ambulatory Visit | Attending: Orthopedic Surgery | Admitting: Orthopedic Surgery

## 2021-05-26 DIAGNOSIS — M25561 Pain in right knee: Secondary | ICD-10-CM

## 2021-05-29 ENCOUNTER — Other Ambulatory Visit: Payer: Self-pay

## 2021-05-29 ENCOUNTER — Ambulatory Visit: Payer: Medicare PPO | Admitting: Orthopedic Surgery

## 2021-05-29 DIAGNOSIS — M79604 Pain in right leg: Secondary | ICD-10-CM | POA: Diagnosis not present

## 2021-05-31 ENCOUNTER — Encounter: Payer: Self-pay | Admitting: Orthopedic Surgery

## 2021-05-31 NOTE — Progress Notes (Signed)
? ?Office Visit Note ?  ?Patient: Megan Wong           ?Date of Birth: [birth year]           ?MRN: [id number] ?Visit Date: 05/29/2021 ?Requested by: Harvie Heck, MD ?[address] ?[address] ?[place],  [place] [address] ?PCP: Harvie Heck, MD ? ?Subjective: ?Chief Complaint  ?Patient presents with  ? Other  ?  Scan review ?  ? ? ?HPI: Megan Wong is a patient here to follow-up her MRI of the knee.  She was noted to have on EMG nerve study peroneal neuropathy affecting the right leg.  MRI scan is reviewed and shows no compressive lesions around the peroneal nerve.  She does have some mild to moderate arthritis worse in the lateral and patellofemoral compartments. ?             ?ROS: All systems reviewed are negative as they relate to the chief complaint within the history of present illness.  Patient denies  fevers or chills. ? ? ?Assessment & Plan: ?Visit Diagnoses:  ?1. Pain in right leg   ? ? ?Plan: Impression is clinical improvement in right leg weakness and peroneal neuropathy symptoms.  I think would be worth documenting improvement with repeat EMG nerve study in June with follow-up visit with me after that.  Decision today was for or against surgical intervention and decompression but with clinical improvement I think we can wait and then see if we can match her clinical improvement with improvement in her nerve conduction parameters.  Dorsiflexion strength is definitely improved compared to last visit. ? ?Follow-Up Instructions: Return in about 4 months (around 09/28/2021).  ? ?Orders:  ?Orders Placed This Encounter  ?Procedures  ? Ambulatory referral to Physical Medicine Rehab  ? ?No orders of the defined types were placed in this encounter. ? ? ? ? Procedures: ?No procedures performed ? ? ?Clinical Data: ?No additional findings. ? ?Objective: ?Vital Signs: There were no vitals taken for this visit. ? ?Physical Exam:  ? ?Constitutional: Patient appears well-developed ?HEENT:  ?Head:  Normocephalic ?Eyes:EOM are normal ?Neck: Normal range of motion ?Cardiovascular: Normal rate ?Pulmonary/chest: Effort normal ?Neurologic: Patient is alert ?Skin: Skin is warm ?Psychiatric: Patient has normal mood and affect ? ? ?Ortho Exam: Ortho exam demonstrates 5 degree flexion contracture of the right knee with some patellofemoral crepitus present.  Negative Tinel's around the fibular head.  No effusion in the knee.  Ankle dorsiflexion strength is comparable to the left-hand side at 5 out of 5.  Pedal pulses palpable.  Eversion strength also symmetric at 5 out of 5 right versus left.  No definite paresthesias in the peroneal distribution right versus left today. ? ?Specialty Comments:  ?No specialty comments available. ? ?Imaging: ?No results found. ? ? ?PMFS History: ?Patient Active Problem List  ? Diagnosis Date Noted  ? Carpal tunnel syndrome, left upper limb 11/26/2017  ? Fibromyalgia 01/21/2017  ? Chronic pain syndrome 01/21/2017  ? Spondylosis without myelopathy or radiculopathy, lumbar region 01/21/2017  ? H/O total hip arthroplasty, right 01/21/2017  ? Incomplete rotator cuff tear right shoulder 07/08/2013  ? ?Past Medical History:  ?Diagnosis Date  ? Arthritis   ? Coronary artery disease   ? STENT 7 YRS AGO - followed by Dr. Leeann Must in [place]   ? Fibromyalgia   ? History of nonmelanoma skin cancer   ? History of palpitations   ? takes Metoprolol - pt denies any Hx HBP  ? RCT (rotator  cuff tear)   ? RT SHOULDER  ? Rupture of biceps tendon   ? RT  ?  ?History reviewed. No pertinent family history.  ?Past Surgical History:  ?Procedure Laterality Date  ? BREAST SURGERY  "MANY YRS AGO"  ? CHRONIC MASTITIS SURG  ? CARDIAC CATHETERIZATION    ? CHOLECYSTECTOMY    ? JOINT REPLACEMENT  2012  ? RT HIP  ? SHOULDER ARTHROSCOPY WITH ROTATOR CUFF REPAIR Right 07/08/2013  ? Procedure: RIGHT SHOULDER ARTHROSCOPY WITH DEBRIDEMENT, Subacromial Decompression;  Surgeon: Kathryne Hitch, MD;  Location: [facility];   Service: Orthopedics;  Laterality: Right;  ? STENTED CORONARY ARTERY  2008  ? ?Social History  ? ?Occupational History  ? Not on file  ?Tobacco Use  ? Smoking status: Former  ?  Types: Cigarettes  ?  Quit date: 07/05/1959  ?  Years since quitting: 61.9  ? Smokeless tobacco: Never  ?Substance and Sexual Activity  ? Alcohol use: No  ? Drug use: No  ? Sexual activity: Not on file  ? ? ? ? ? ?

## 2021-08-20 ENCOUNTER — Telehealth: Payer: Self-pay | Admitting: Physical Medicine and Rehabilitation

## 2021-08-20 NOTE — Telephone Encounter (Signed)
Pt called to cancel appt. Pt states she is feeling better and appt not needed. Please call pt to confirm. Pt phone number is [phone]

## 2021-08-28 ENCOUNTER — Encounter: Payer: Medicare PPO | Admitting: Physical Medicine and Rehabilitation
